# Patient Record
Sex: Male | Born: 1960 | Race: Black or African American | Hispanic: No | Marital: Single | Smoking: Current some day smoker
Health system: Southern US, Community
[De-identification: ages and names within clinical notes are randomized; demographics above are authoritative.]

## PROBLEM LIST (undated history)

## (undated) DIAGNOSIS — R413 Other amnesia: Secondary | ICD-10-CM

## (undated) DIAGNOSIS — J449 Chronic obstructive pulmonary disease, unspecified: Secondary | ICD-10-CM

## (undated) DIAGNOSIS — N289 Disorder of kidney and ureter, unspecified: Secondary | ICD-10-CM

## (undated) DIAGNOSIS — E119 Type 2 diabetes mellitus without complications: Secondary | ICD-10-CM

## (undated) DIAGNOSIS — F909 Attention-deficit hyperactivity disorder, unspecified type: Secondary | ICD-10-CM

## (undated) DIAGNOSIS — F329 Major depressive disorder, single episode, unspecified: Secondary | ICD-10-CM

## (undated) DIAGNOSIS — J45909 Unspecified asthma, uncomplicated: Secondary | ICD-10-CM

## (undated) DIAGNOSIS — Z86718 Personal history of other venous thrombosis and embolism: Secondary | ICD-10-CM

## (undated) DIAGNOSIS — F32A Depression, unspecified: Secondary | ICD-10-CM

## (undated) HISTORY — DX: Chronic obstructive pulmonary disease, unspecified: J44.9

## (undated) HISTORY — PX: HEMORRHOID SURGERY: SHX153

## (undated) HISTORY — DX: Attention-deficit hyperactivity disorder, unspecified type: F90.9

## (undated) HISTORY — PX: KNEE SURGERY: SHX244

## (undated) HISTORY — DX: Other amnesia: R41.3

## (undated) HISTORY — PX: FOOT SURGERY: SHX648

## (undated) HISTORY — PX: APPENDECTOMY: SHX54

---

## 1998-08-22 ENCOUNTER — Emergency Department (HOSPITAL_COMMUNITY): Admission: EM | Admit: 1998-08-22 | Discharge: 1998-08-22 | Payer: Self-pay | Admitting: Emergency Medicine

## 1998-09-14 ENCOUNTER — Encounter: Admission: RE | Admit: 1998-09-14 | Discharge: 1998-09-14 | Payer: Self-pay | Admitting: Internal Medicine

## 1998-09-15 ENCOUNTER — Encounter: Admission: RE | Admit: 1998-09-15 | Discharge: 1998-09-15 | Payer: Self-pay | Admitting: Hematology and Oncology

## 1998-09-24 ENCOUNTER — Ambulatory Visit (HOSPITAL_COMMUNITY): Admission: RE | Admit: 1998-09-24 | Discharge: 1998-09-24 | Payer: Self-pay

## 2003-04-22 ENCOUNTER — Emergency Department (HOSPITAL_COMMUNITY): Admission: EM | Admit: 2003-04-22 | Discharge: 2003-04-22 | Payer: Self-pay | Admitting: Emergency Medicine

## 2004-07-17 ENCOUNTER — Emergency Department (HOSPITAL_COMMUNITY): Admission: EM | Admit: 2004-07-17 | Discharge: 2004-07-18 | Payer: Self-pay | Admitting: Emergency Medicine

## 2005-09-05 ENCOUNTER — Emergency Department (HOSPITAL_COMMUNITY): Admission: EM | Admit: 2005-09-05 | Discharge: 2005-09-05 | Payer: Self-pay | Admitting: Emergency Medicine

## 2006-07-27 ENCOUNTER — Emergency Department (HOSPITAL_COMMUNITY): Admission: EM | Admit: 2006-07-27 | Discharge: 2006-07-28 | Payer: Self-pay | Admitting: Emergency Medicine

## 2006-07-29 ENCOUNTER — Emergency Department (HOSPITAL_COMMUNITY): Admission: EM | Admit: 2006-07-29 | Discharge: 2006-07-29 | Payer: Self-pay | Admitting: Family Medicine

## 2006-08-06 ENCOUNTER — Emergency Department (HOSPITAL_COMMUNITY): Admission: EM | Admit: 2006-08-06 | Discharge: 2006-08-06 | Payer: Self-pay | Admitting: Family Medicine

## 2009-12-21 ENCOUNTER — Emergency Department (HOSPITAL_COMMUNITY): Admission: EM | Admit: 2009-12-21 | Discharge: 2009-12-22 | Payer: Self-pay | Admitting: Emergency Medicine

## 2010-12-03 ENCOUNTER — Emergency Department (HOSPITAL_COMMUNITY)
Admission: EM | Admit: 2010-12-03 | Discharge: 2010-12-03 | Payer: Self-pay | Source: Home / Self Care | Admitting: Emergency Medicine

## 2010-12-06 LAB — COMPREHENSIVE METABOLIC PANEL
ALT: 24 U/L (ref 0–53)
AST: 32 U/L (ref 0–37)
Albumin: 3 g/dL — ABNORMAL LOW (ref 3.5–5.2)
Alkaline Phosphatase: 56 U/L (ref 39–117)
BUN: 9 mg/dL (ref 6–23)
CO2: 26 mEq/L (ref 19–32)
Calcium: 8.4 mg/dL (ref 8.4–10.5)
Chloride: 106 mEq/L (ref 96–112)
Creatinine, Ser: 0.99 mg/dL (ref 0.4–1.5)
GFR calc Af Amer: >60 mL/min (ref >60)
GFR calc non Af Amer: >60 mL/min (ref >60)
Glucose, Bld: 121 mg/dL — ABNORMAL HIGH (ref 70–99)
Potassium: 3.7 mEq/L (ref 3.5–5.1)
Sodium: 138 mEq/L (ref 135–145)
Total Bilirubin: 1.2 mg/dL (ref 0.3–1.2)
Total Protein: 5.7 g/dL — ABNORMAL LOW (ref 6.0–8.3)

## 2010-12-06 LAB — URINALYSIS, ROUTINE W REFLEX MICROSCOPIC
Bilirubin Urine: NEGATIVE
Ketones, ur: NEGATIVE mg/dL
Leukocytes, UA: NEGATIVE
Nitrite: NEGATIVE
Protein, ur: NEGATIVE mg/dL
Specific Gravity, Urine: 1.016 (ref 1.005–1.030)
Urine Glucose, Fasting: NEGATIVE mg/dL
Urobilinogen, UA: 2 mg/dL — ABNORMAL HIGH (ref 0.0–1.0)
pH: 6 (ref 5.0–8.0)

## 2010-12-06 LAB — DIFFERENTIAL
Basophils Absolute: 0 10*3/uL (ref 0.0–0.1)
Basophils Relative: 0 % (ref 0–1)
Eosinophils Absolute: 0.3 10*3/uL (ref 0.0–0.7)
Eosinophils Relative: 4 % (ref 0–5)
Lymphocytes Relative: 14 % (ref 12–46)
Lymphs Abs: 1 10*3/uL (ref 0.7–4.0)
Monocytes Absolute: 0.9 10*3/uL (ref 0.1–1.0)
Monocytes Relative: 12 % (ref 3–12)
Neutro Abs: 5.2 10*3/uL (ref 1.7–7.7)
Neutrophils Relative %: 70 % (ref 43–77)

## 2010-12-06 LAB — CBC
HCT: 39.2 % (ref 39.0–52.0)
Hemoglobin: 13.6 g/dL (ref 13.0–17.0)
MCH: 31.7 pg (ref 26.0–34.0)
MCHC: 34.7 g/dL (ref 30.0–36.0)
MCV: 91.4 fL (ref 78.0–100.0)
Platelets: 207 10*3/uL (ref 150–400)
RBC: 4.29 MIL/uL (ref 4.22–5.81)
RDW: 11.6 % (ref 11.5–15.5)
WBC: 7.4 10*3/uL (ref 4.0–10.5)

## 2010-12-06 LAB — URINE MICROSCOPIC-ADD ON

## 2011-01-25 ENCOUNTER — Emergency Department (HOSPITAL_COMMUNITY)
Admission: EM | Admit: 2011-01-25 | Discharge: 2011-01-25 | Disposition: A | Discharge status: Home / Self Care | Payer: Medicaid Other | Attending: Emergency Medicine | Admitting: Emergency Medicine

## 2011-01-25 DIAGNOSIS — J4489 Other specified chronic obstructive pulmonary disease: Secondary | ICD-10-CM | POA: Insufficient documentation

## 2011-01-25 DIAGNOSIS — M25519 Pain in unspecified shoulder: Secondary | ICD-10-CM | POA: Insufficient documentation

## 2011-01-25 DIAGNOSIS — J449 Chronic obstructive pulmonary disease, unspecified: Secondary | ICD-10-CM | POA: Insufficient documentation

## 2011-01-25 DIAGNOSIS — H409 Unspecified glaucoma: Secondary | ICD-10-CM | POA: Insufficient documentation

## 2011-01-25 DIAGNOSIS — F988 Other specified behavioral and emotional disorders with onset usually occurring in childhood and adolescence: Secondary | ICD-10-CM | POA: Insufficient documentation

## 2011-01-25 DIAGNOSIS — R109 Unspecified abdominal pain: Secondary | ICD-10-CM | POA: Insufficient documentation

## 2011-01-25 DIAGNOSIS — G8929 Other chronic pain: Secondary | ICD-10-CM | POA: Insufficient documentation

## 2011-01-25 DIAGNOSIS — K5900 Constipation, unspecified: Secondary | ICD-10-CM | POA: Insufficient documentation

## 2011-02-01 LAB — CBC
HCT: 42.5 % (ref 39.0–52.0)
MCV: 98 fL (ref 78.0–100.0)
Platelets: 198 10*3/uL (ref 150–400)
RBC: 4.33 MIL/uL (ref 4.22–5.81)
WBC: 10.9 10*3/uL — ABNORMAL HIGH (ref 4.0–10.5)

## 2011-02-01 LAB — URINALYSIS, ROUTINE W REFLEX MICROSCOPIC
Glucose, UA: NEGATIVE mg/dL
Nitrite: NEGATIVE
Specific Gravity, Urine: 1.025 (ref 1.005–1.030)
pH: 6 (ref 5.0–8.0)

## 2011-02-01 LAB — DIFFERENTIAL
Lymphocytes Relative: 19 % (ref 12–46)
Lymphs Abs: 2.1 10*3/uL (ref 0.7–4.0)
Monocytes Relative: 12 % (ref 3–12)
Neutrophils Relative %: 64 % (ref 43–77)

## 2011-02-01 LAB — RAPID URINE DRUG SCREEN, HOSP PERFORMED
Barbiturates: NOT DETECTED
Benzodiazepines: NOT DETECTED

## 2011-02-01 LAB — D-DIMER, QUANTITATIVE: D-Dimer, Quant: 0.29 ug/mL-FEU (ref 0.00–0.48)

## 2011-02-01 LAB — BASIC METABOLIC PANEL
BUN: 14 mg/dL (ref 6–23)
Chloride: 104 mEq/L (ref 96–112)
GFR calc Af Amer: >60 mL/min (ref >60)
GFR calc non Af Amer: >60 mL/min (ref >60)
Potassium: 3.8 mEq/L (ref 3.5–5.1)

## 2011-02-01 LAB — URINE MICROSCOPIC-ADD ON

## 2011-02-01 LAB — SALICYLATE LEVEL: Salicylate Lvl: <4 mg/dL (ref 2.8–20.0)

## 2011-02-01 LAB — POCT CARDIAC MARKERS
CKMB, poc: <1 ng/mL — ABNORMAL LOW (ref 1.0–8.0)
Myoglobin, poc: 34.2 ng/mL (ref 12–200)
Troponin i, poc: <0.05 ng/mL (ref 0.00–0.09)

## 2011-02-01 LAB — ETHANOL: Alcohol, Ethyl (B): <5 mg/dL (ref 0–10)

## 2011-02-01 LAB — ACETAMINOPHEN LEVEL: Acetaminophen (Tylenol), Serum: <10 ug/mL — ABNORMAL LOW (ref 10–30)

## 2011-08-26 ENCOUNTER — Emergency Department (HOSPITAL_COMMUNITY)
Admission: EM | Admit: 2011-08-26 | Discharge: 2011-08-26 | Disposition: A | Discharge status: Home / Self Care | Payer: Medicaid Other | Attending: Emergency Medicine | Admitting: Emergency Medicine

## 2011-08-26 ENCOUNTER — Emergency Department (HOSPITAL_COMMUNITY): Payer: Medicaid Other

## 2011-08-26 DIAGNOSIS — W11XXXA Fall on and from ladder, initial encounter: Secondary | ICD-10-CM | POA: Insufficient documentation

## 2011-08-26 DIAGNOSIS — M25519 Pain in unspecified shoulder: Secondary | ICD-10-CM | POA: Insufficient documentation

## 2011-08-26 DIAGNOSIS — S40019A Contusion of unspecified shoulder, initial encounter: Secondary | ICD-10-CM | POA: Insufficient documentation

## 2011-08-26 DIAGNOSIS — F988 Other specified behavioral and emotional disorders with onset usually occurring in childhood and adolescence: Secondary | ICD-10-CM | POA: Insufficient documentation

## 2011-08-26 DIAGNOSIS — M25539 Pain in unspecified wrist: Secondary | ICD-10-CM | POA: Insufficient documentation

## 2011-08-26 DIAGNOSIS — J4489 Other specified chronic obstructive pulmonary disease: Secondary | ICD-10-CM | POA: Insufficient documentation

## 2011-08-26 DIAGNOSIS — F17200 Nicotine dependence, unspecified, uncomplicated: Secondary | ICD-10-CM | POA: Insufficient documentation

## 2011-08-26 DIAGNOSIS — S63509A Unspecified sprain of unspecified wrist, initial encounter: Secondary | ICD-10-CM | POA: Insufficient documentation

## 2011-08-26 DIAGNOSIS — J449 Chronic obstructive pulmonary disease, unspecified: Secondary | ICD-10-CM | POA: Insufficient documentation

## 2012-05-27 ENCOUNTER — Ambulatory Visit: Payer: Self-pay

## 2014-10-20 LAB — DRUG SCREEN, URINE
Amphetamines, Ur Screen: NEGATIVE (ref ?–1000)
BARBITURATES, UR SCREEN: NEGATIVE (ref ?–200)
Benzodiazepine, Ur Scrn: NEGATIVE (ref ?–200)
CANNABINOID 50 NG, UR ~~LOC~~: NEGATIVE (ref ?–50)
Cocaine Metabolite,Ur ~~LOC~~: NEGATIVE (ref ?–300)
MDMA (ECSTASY) UR SCREEN: NEGATIVE (ref ?–500)
Methadone, Ur Screen: NEGATIVE (ref ?–300)
Opiate, Ur Screen: NEGATIVE (ref ?–300)
PHENCYCLIDINE (PCP) UR S: NEGATIVE (ref ?–25)
TRICYCLIC, UR SCREEN: NEGATIVE (ref ?–1000)

## 2014-10-20 LAB — COMPREHENSIVE METABOLIC PANEL
ALBUMIN: 2.8 g/dL — AB (ref 3.4–5.0)
ANION GAP: 8 (ref 7–16)
Alkaline Phosphatase: 70 U/L
BILIRUBIN TOTAL: 0.6 mg/dL (ref 0.2–1.0)
BUN: 18 mg/dL (ref 7–18)
CALCIUM: 8.8 mg/dL (ref 8.5–10.1)
Chloride: 95 mmol/L — ABNORMAL LOW (ref 98–107)
Co2: 28 mmol/L (ref 21–32)
Creatinine: 1.44 mg/dL — ABNORMAL HIGH (ref 0.60–1.30)
EGFR (African American): >60
GFR CALC NON AF AMER: 55 — AB
Glucose: 131 mg/dL — ABNORMAL HIGH (ref 65–99)
OSMOLALITY: 266 (ref 275–301)
Potassium: 4 mmol/L (ref 3.5–5.1)
SGOT(AST): 30 U/L (ref 15–37)
SGPT (ALT): 34 U/L
Sodium: 131 mmol/L — ABNORMAL LOW (ref 136–145)
TOTAL PROTEIN: 8.2 g/dL (ref 6.4–8.2)

## 2014-10-20 LAB — URINALYSIS, COMPLETE
BILIRUBIN, UR: NEGATIVE
GLUCOSE, UR: NEGATIVE mg/dL (ref 0–75)
Ketone: NEGATIVE
Nitrite: NEGATIVE
PH: 6 (ref 4.5–8.0)
Protein: 30
SPECIFIC GRAVITY: 1.01 (ref 1.003–1.030)
WBC UR: 1676 /HPF (ref 0–5)

## 2014-10-20 LAB — CBC
HCT: 42.5 % (ref 40.0–52.0)
HGB: 13.8 g/dL (ref 13.0–18.0)
MCH: 32.3 pg (ref 26.0–34.0)
MCHC: 32.5 g/dL (ref 32.0–36.0)
MCV: 100 fL (ref 80–100)
Platelet: 258 10*3/uL (ref 150–440)
RBC: 4.27 10*6/uL — ABNORMAL LOW (ref 4.40–5.90)
RDW: 11.9 % (ref 11.5–14.5)
WBC: 14.7 10*3/uL — AB (ref 3.8–10.6)

## 2014-10-20 LAB — AMMONIA: Ammonia, Plasma: 18 mcmol/L (ref 11–32)

## 2014-10-20 LAB — ETHANOL: Ethanol: <3 mg/dL

## 2014-10-21 ENCOUNTER — Inpatient Hospital Stay: Payer: Self-pay | Admitting: Internal Medicine

## 2014-10-21 LAB — CBC WITH DIFFERENTIAL/PLATELET
BASOS ABS: 0.1 10*3/uL (ref 0.0–0.1)
BASOS PCT: 0.7 %
EOS PCT: 0.3 %
Eosinophil #: 0 10*3/uL (ref 0.0–0.7)
HCT: 38.3 % — ABNORMAL LOW (ref 40.0–52.0)
HGB: 12.4 g/dL — ABNORMAL LOW (ref 13.0–18.0)
LYMPHS PCT: 6.4 %
Lymphocyte #: 1 10*3/uL (ref 1.0–3.6)
MCH: 32.3 pg (ref 26.0–34.0)
MCHC: 32.3 g/dL (ref 32.0–36.0)
MCV: 100 fL (ref 80–100)
MONO ABS: 2.4 x10 3/mm — AB (ref 0.2–1.0)
Monocyte %: 15.7 %
Neutrophil #: 11.9 10*3/uL — ABNORMAL HIGH (ref 1.4–6.5)
Neutrophil %: 76.9 %
PLATELETS: 240 10*3/uL (ref 150–440)
RBC: 3.84 10*6/uL — AB (ref 4.40–5.90)
RDW: 12.2 % (ref 11.5–14.5)
WBC: 15.5 10*3/uL — ABNORMAL HIGH (ref 3.8–10.6)

## 2014-10-21 LAB — GC/CHLAMYDIA PROBE AMP

## 2014-10-22 LAB — CBC WITH DIFFERENTIAL/PLATELET
BASOS ABS: 0.1 10*3/uL (ref 0.0–0.1)
Basophil %: 0.6 %
EOS ABS: 0.2 10*3/uL (ref 0.0–0.7)
Eosinophil %: 2.1 %
HCT: 35.7 % — AB (ref 40.0–52.0)
HGB: 11.9 g/dL — AB (ref 13.0–18.0)
LYMPHS ABS: 1 10*3/uL (ref 1.0–3.6)
LYMPHS PCT: 11.3 %
MCH: 33 pg (ref 26.0–34.0)
MCHC: 33.4 g/dL (ref 32.0–36.0)
MCV: 99 fL (ref 80–100)
Monocyte #: 1.4 x10 3/mm — ABNORMAL HIGH (ref 0.2–1.0)
Monocyte %: 14.7 %
NEUTROS ABS: 6.6 10*3/uL — AB (ref 1.4–6.5)
NEUTROS PCT: 71.3 %
Platelet: 246 10*3/uL (ref 150–440)
RBC: 3.61 10*6/uL — ABNORMAL LOW (ref 4.40–5.90)
RDW: 11.7 % (ref 11.5–14.5)
WBC: 9.2 10*3/uL (ref 3.8–10.6)

## 2014-10-22 LAB — BASIC METABOLIC PANEL
Anion Gap: 7 (ref 7–16)
BUN: 11 mg/dL (ref 7–18)
CO2: 27 mmol/L (ref 21–32)
Calcium, Total: 8.3 mg/dL — ABNORMAL LOW (ref 8.5–10.1)
Chloride: 103 mmol/L (ref 98–107)
Creatinine: 0.93 mg/dL (ref 0.60–1.30)
EGFR (African American): >60
EGFR (Non-African Amer.): >60
Glucose: 112 mg/dL — ABNORMAL HIGH (ref 65–99)
Osmolality: 274 (ref 275–301)
POTASSIUM: 4 mmol/L (ref 3.5–5.1)
Sodium: 137 mmol/L (ref 136–145)

## 2014-10-23 LAB — URINE CULTURE

## 2015-03-06 NOTE — Discharge Summary (Signed)
PATIENT NAME:  Jesse Lam, Jesse Lam MR#:  [id number] DATE OF BIRTH:  1961-07-07  DATE OF ADMISSION:  10/21/2014 DATE OF DISCHARGE:    DISCHARGE DIAGNOSES:  1.  Gram-negative rod urinary tract infection with possible pyelonephritis.  2.  Anemia of chronic disease.  3.  Bipolar disorder.  4.  Hyponatremia.  5.  Dehydration.   IMAGING STUDIES: Include: 1.  CT scan of the head without contrast for headache which showed no acute changes.  2.  CT scan of the abdomen and pelvis without contrast for a stone. Showed bladder wall thickening suggesting cystitis or hypertrophy , mild stranding around kidneys, may indicate pyelonephritis.   ADMITTING HISTORY AND PHYSICAL: Please see detailed H and P dictated by Dr. Sheryle Hailiamond. In brief, a 54 year old African American male patient brought into the hospital complaining of abdominal pain. The patient was found to have a URI and pyelonephritis. Admitted to hospitalist service.   HOSPITAL COURSE:  1.  Gram-negative rod UTI with nephritis. The patient is being discharged home on ciprofloxacin now that he is afebrile. White count has trended down to normal range. His acute renal failure has resolved and the patient's symptoms have stopped. He is being given ciprofloxacin to finish a 10-day course. His onset of UTI is likely secondary from his urinary retention and hesitancy from his prostate issues. He has also been started on Flomax. He will follow up with his primary care physician. I have advised him to return to the Emergency Room if he has any further fever. 2.  Prior to discharge has no abdominal tenderness. Bowel sounds are present. Lungs sound clear. S1, S2 heard.   DISCHARGE MEDICATIONS:  1.  Flomax 0.4 mg daily.  2.  Ciprofloxacin 500 mg oral 2 times a day.  3.  Norco 5/325 one tablet oral 3 times a day as needed for pain.   DISCHARGE INSTRUCTIONS: Regular diet. Activity as tolerated. Follow up with Dr. Maryellen PileEason in 1-2 weeks.   TIME SPENT ON DAY OF DISCHARGE  AND DISCHARGE ACTIVITY: 35 minutes.  ____________________________ Molinda BailiffSrikar R. Lilliane Sposito, MD srs:am D: 10/22/2014 12:06:27 ET T: 10/23/2014 01:25:57 ET JOB#: [id number]  cc: Wardell HeathSrikar R. Phineas Mcenroe, MD, <Dictator> Orie FishermanSRIKAR R [employer] MD ELECTRONICALLY SIGNED 10/29/2014 13:09

## 2015-03-06 NOTE — H&P (Signed)
PATIENT NAME:  Jesse Lam, Jesse Lam MR#:  [id number] DATE OF BIRTH:  1961-11-13  DATE OF ADMISSION:  10/21/2014  REFERRING PHYSICIAN: Francene Castle, MD  PRIMARY CARE PHYSICIAN: Nonlocal.   ADMISSION DIAGNOSIS: Pyelonephritis.   HISTORY OF PRESENT ILLNESS: This is a 54 year old African American male who presents to the Emergency Department complaining of 7 days of pain in his abdomen and difficulty urinating. The patient states that he started feeling sick with a slight fever at the beginning of that time. Now he says he is "shutting down". The patient states that the main concern is mostly pain. He states that it radiates from the left lower quadrant to the right upper quadrant and around to his flanks. At this time, the patient complains of headache and admitting to feeling like he has trouble focusing. He states that he has a lot of "infection in his face". The patient also admits that he has some shoulder pain. Evaluation in the Emergency Department showed a urinalysis consistent with urinary tract infection and CT scan of the abdomen which showed some perinephric stranding consistent with pyelonephritis, which prompted the Emergency Department to call for admission.   REVIEW OF SYSTEMS: CONSTITUTIONAL: The patient admits to subjective fevers and generalized malaise.  EYES: Denies inflammation or blurred vision.  EARS, NOSE AND THROAT: Denies tinnitus or sore throat.  RESPIRATORY: Denies cough or shortness of breath.  CARDIOVASCULAR: Denies chest pain or palpitations.  GASTROINTESTINAL: Admits to abdominal pain as well as some nausea, but denies vomiting or diarrhea.  GENITOURINARY: The patient denies dysuria, but admits to increased frequency of urination.  ENDOCRINE: Denies polyuria or polydipsia.  HEMATOLOGIC AND LYMPHATIC: Denies easy bruising or bleeding.  INTEGUMENT: Denies rashes or lesions.  MUSCULOSKELETAL: Denies arthralgias, but admits to some generalized muscle aches.  NEUROLOGIC:  Denies numbness in his extremities. PSYCHIATRIC: The patient admits to severe ADHD as well as depression and anxiety, but denies any suicidal or homicidal ideation. He also denies any auditory or visual hallucinations.   PAST MEDICAL HISTORY: Attention deficit hyperactivity disorder, depression, anxiety, unrepaired rotator cuff injury.   PAST SURGICAL HISTORY: Multiple abdominal surgeries, although the patient only has 1 scar on his belly from an appendectomy, hemorrhoidectomies, a left hand reconstruction, as well as repair of left foot injury from stepping on glass. It is unclear if this was an orthopedic or vascular surgery.   SOCIAL HISTORY: The patient has a history of smoking, alcohol and drug abuse. He is vague and unfocused when trying to quantify and define specifically which substances he prefers to abuse.  FAMILY HISTORY: Cancer throughout the family.   MEDICATIONS: None.   ALLERGIES: No known drug allergies.   PERTINENT LABORATORY RESULTS AND RADIOGRAPHIC FINDINGS: Serum glucose 131, BUN 18, creatinine 1.44, serum sodium 131, potassium 4, chloride 95, CO2 28, calcium 8.8, serum albumin 2.8, alk phos 70, AST 30, ALT 34. Urine drug screen is negative. White blood cell count 14.7, hemoglobin 13.8, hematocrit 42.5, MCV 100. GC/Chlamydia is negative. Urinalysis shows 1676 white blood cells per high-power field. His urine is nitrite negative and 3+ leukocyte esterase positive.  X-ray of the abdomen shows mild bronchitic changes in the lungs with a normal gas pattern throughout the bowel and a nonobstructing renal calculus versus costal cartilaginous calcification at the mid right kidney that is 7 x 5 mm in dimensions.  CT of the abdomen and pelvis shows bladder wall thickening suggestive of cystitis and hypertrophy due to outlet obstruction. There is no renal or ureteral stone or  obstruction. The patient has mild stranding around the kidneys that may indicate pyelonephritis.   CT of the  patient's head is unremarkable.   PHYSICAL EXAMINATION: VITAL SIGNS: Temperature is 98, pulse 52, respirations 18, blood pressure 111/61, pulse ox 100% on room air.  GENERAL: The patient is alert and oriented x3, but in some distress, be it psychological or physical. The patient's complaints are wide ranging and sometimes nonspecific.  HEENT: Normocephalic, atraumatic. Pupils equal, round and reactive to light and accommodation. Extraocular movements are intact. Mucous membranes are moist. There is no photophobia nor any sinus tenderness to palpation.  NECK: Trachea is midline.  CHEST: Symmetric and atraumatic.  CARDIOVASCULAR: Regular rate and rhythm. Normal S1, S2. No rubs, clicks, or murmurs appreciated.  LUNGS: Clear to auscultation bilaterally. Normal effort and excursion.  ABDOMEN: Positive bowel sounds. Soft. Diffusely tender with positive guarding, but no rebound tenderness. There is no hepatosplenomegaly. There is a scar on the right lower quadrant consistent with previous appendectomy.  GENITOURINARY: Normal external male genitalia.  MUSCULOSKELETAL: The patient moves all 4 extremities equally. There is 5/5 strength in upper and lower extremities bilaterally.  SKIN: No rashes or lesions.  EXTREMITIES: No clubbing, cyanosis, or edema. NEUROLOGIC: Cranial nerves II through XII are grossly intact.  PSYCHIATRIC: Mood is anxious. Affect is congruent.   ASSESSMENT AND PLAN: This is a 54 year old male admitted for pyelonephritis.  1.  Pyelonephritis, stranding seen on CT of the abdomen. The patient endorses tremendous pain but thankfully he is afebrile and does not have any peritoneal signs. He is given ceftriaxone in the Emergency Department. We will continue antibiotics and try to manage the patient's pain accordingly.  2.  Depression and anxiety. This likely amplifies the patient's perception of pain. He may need a psych consult while in house or certainly mental health follow-up upon  discharge.  3.  Attention deficit/hyperactivity disorder. The patient is very hard to follow. His history of present illness is somewhat disjointed. The patient is pleasant, but may have deeper psychiatric issues, although as stated above he does not admit to any hallucinations.  4.  Shoulder pain. The patient has a history of an unrepaired rotator cuff. His shoulder pain and abdominal pain could be some referred pain from his viscera; however, his gallbladder and pancreas have a normal appearance on CAT scan.  5.  Deep vein thrombosis prophylaxis. Heparin.  6.  Gastrointestinal prophylaxis. None, although I might consider gastroesophageal reflux treatment in this patient was multiple abdominal complaints.   CODE STATUS: The patient is a FULL code.   TIME SPENT ON ADMISSION ORDERS AND PATIENT CARE: Approximately 45 minutes. ____________________________ Norva Riffle. Marcille Blanco, MD msd:sb D: 10/21/2014 08:20:14 ET T: 10/21/2014 08:33:47 ET JOB#: [id number]  cc: Norva Riffle. Marcille Blanco, MD, <Dictator> Norva Riffle Annalyssa Thune MD ELECTRONICALLY SIGNED 10/30/2014 0:09

## 2015-04-06 ENCOUNTER — Emergency Department (HOSPITAL_COMMUNITY)
Admission: EM | Admit: 2015-04-06 | Discharge: 2015-04-06 | Disposition: A | Discharge status: Home / Self Care | Payer: Medicaid Other | Attending: Emergency Medicine | Admitting: Emergency Medicine

## 2015-04-06 ENCOUNTER — Emergency Department (HOSPITAL_COMMUNITY): Payer: Medicaid Other

## 2015-04-06 ENCOUNTER — Encounter (HOSPITAL_COMMUNITY): Payer: Self-pay

## 2015-04-06 DIAGNOSIS — Z720 Tobacco use: Secondary | ICD-10-CM | POA: Insufficient documentation

## 2015-04-06 DIAGNOSIS — Y9339 Activity, other involving climbing, rappelling and jumping off: Secondary | ICD-10-CM | POA: Diagnosis not present

## 2015-04-06 DIAGNOSIS — Y998 Other external cause status: Secondary | ICD-10-CM | POA: Insufficient documentation

## 2015-04-06 DIAGNOSIS — E119 Type 2 diabetes mellitus without complications: Secondary | ICD-10-CM | POA: Insufficient documentation

## 2015-04-06 DIAGNOSIS — S0990XA Unspecified injury of head, initial encounter: Secondary | ICD-10-CM | POA: Insufficient documentation

## 2015-04-06 DIAGNOSIS — W228XXA Striking against or struck by other objects, initial encounter: Secondary | ICD-10-CM | POA: Insufficient documentation

## 2015-04-06 DIAGNOSIS — Y9289 Other specified places as the place of occurrence of the external cause: Secondary | ICD-10-CM | POA: Diagnosis not present

## 2015-04-06 DIAGNOSIS — Z8659 Personal history of other mental and behavioral disorders: Secondary | ICD-10-CM | POA: Diagnosis not present

## 2015-04-06 DIAGNOSIS — Z87448 Personal history of other diseases of urinary system: Secondary | ICD-10-CM | POA: Insufficient documentation

## 2015-04-06 DIAGNOSIS — S199XXA Unspecified injury of neck, initial encounter: Secondary | ICD-10-CM | POA: Diagnosis not present

## 2015-04-06 DIAGNOSIS — J45909 Unspecified asthma, uncomplicated: Secondary | ICD-10-CM | POA: Diagnosis not present

## 2015-04-06 DIAGNOSIS — R202 Paresthesia of skin: Secondary | ICD-10-CM

## 2015-04-06 HISTORY — DX: Major depressive disorder, single episode, unspecified: F32.9

## 2015-04-06 HISTORY — DX: Unspecified asthma, uncomplicated: J45.909

## 2015-04-06 HISTORY — DX: Personal history of other venous thrombosis and embolism: Z86.718

## 2015-04-06 HISTORY — DX: Depression, unspecified: F32.A

## 2015-04-06 HISTORY — DX: Type 2 diabetes mellitus without complications: E11.9

## 2015-04-06 HISTORY — DX: Disorder of kidney and ureter, unspecified: N28.9

## 2015-04-06 LAB — CBG MONITORING, ED: GLUCOSE-CAPILLARY: 89 mg/dL (ref 65–99)

## 2015-04-06 MED ORDER — HYDROCODONE-ACETAMINOPHEN 5-325 MG PO TABS: 1.0000 | ORAL_TABLET | ORAL | Status: DC | PRN

## 2015-04-06 NOTE — ED Provider Notes (Signed)
CSN: [id number]     Arrival date & time 04/06/15  1749 History  This chart is scribed for non-physician practitioner, Trixie Dredge, PA-C, working with Arby Barrette, MD by Abel Presto, ED Scribe.  This patient was seen in room WTR9/WTR9 and the patient's care was started 6:19 PM.      Chief Complaint  Patient presents with  . Neck Injury  . Numbness    The history is provided by the patient. No language interpreter was used.   HPI Comments: Jesse Lam is a 54 y.o. male with PMHx of DM, ADD and renal disorderwho presents to the Emergency Department complaining of neck injury 3 days ago. Pt states he lost his balance while opening a window. Pt states his legs gave way and he fell forward slamming his forehead into the window sill. He states he jumped up quickly after impact and window came down onto the back of his neck. He reports associated left and posterior neck pain described as "an elephant sitting on my neck, "numbness in bilateral hands, severe pins and needles pain in left upper arm.". Pt has taken Tylenol and uses muscle cream for relief. Pt is able to ambulate. Pt reports h/o of several concussions and notes recurrent double vision and equilibrium problems at baseline. He notes he was recently diagnosed with DM but he is unsure of how diagnosis came about. He reports recent neuropathy in left leg and states pain in left arm is similar.  He denies worsening changes in equilibrium since recent incident. Pt denies LOC and confusion.  States that overall, since the injury, his symptoms are gradually improving. Pt is not on blood thinners but does take daily ASA.  Past Medical History  Diagnosis Date  . Diabetes mellitus without complication   . Asthma   . Depression   . Renal disorder    Past Surgical History  Procedure Laterality Date  . Appendectomy    . Hemorrhoid surgery    . Knee surgery     History reviewed. No pertinent family history. History  Substance Use Topics  .  Smoking status: Current Some Day Smoker  . Smokeless tobacco: Not on file  . Alcohol Use: Yes     Comment: social    Review of Systems  Constitutional: Negative for activity change, appetite change and fatigue.  HENT: Negative for facial swelling.   Eyes: Positive for visual disturbance (chronic, unchanged).  Musculoskeletal: Positive for myalgias, arthralgias and neck pain. Negative for back pain, joint swelling and gait problem.  Skin: Positive for wound (healing wound right forehead).  Allergic/Immunologic: Negative for immunocompromised state.  Neurological: Positive for weakness, numbness and headaches. Negative for syncope and speech difficulty.  Hematological: Does not bruise/bleed easily.  Psychiatric/Behavioral: Positive for decreased concentration (baseline). Negative for confusion and self-injury (accidental).      Allergies  Review of patient's allergies indicates no known allergies.  Home Medications   Prior to Admission medications   Not on File   BP 130/75 mmHg  Pulse 78  Temp(Src) 98.5 F (36.9 C) (Oral)  Resp 18  SpO2 99% Physical Exam  Constitutional: He appears well-developed and well-nourished. No distress.  HENT:  Head: Normocephalic.    Neck: Neck supple.  Pulmonary/Chest: Effort normal.  Musculoskeletal:       Cervical back: He exhibits tenderness (posterior and left neck).       Back:  Upper extremities:  Strength 5/5, sensation intact, distal pulses intact.   Increased pain and sensitivity to light  touch over bilateral 3rd-5th fingers making pt unable to do grip strength testing. Lower extremities:  Strength 5/5, sensation intact, distal pulses intact.      Neurological: He is alert. He has normal strength. No sensory deficit. He exhibits normal muscle tone. GCS eye subscore is 4. GCS verbal subscore is 5. GCS motor subscore is 6.  CN II-XII intact, EOMs intact, no pronator drift, grip strengths equal bilaterally; strength 5/5 in all  extremities, sensation intact in all extremities; finger to nose, heel to shin, rapid alternating movements normal; gait is normal.    Skin: He is not diaphoretic.  Nursing note and vitals reviewed.   ED Course  Procedures (including critical care time) DIAGNOSTIC STUDIES: Oxygen Saturation is 99% on room air, normal by my interpretation.    COORDINATION OF CARE: 6:27 PM Discussed treatment plan with patient at beside, the patient agrees with the plan and has no further questions at this time.   Labs Review Labs Reviewed - No data to display  Imaging Review Ct Head Wo Contrast  04/06/2015   CLINICAL DATA:  Neck injury 3 days ago, bilateral finger numbness  EXAM: CT HEAD WITHOUT CONTRAST  CT CERVICAL SPINE WITHOUT CONTRAST  TECHNIQUE: Multidetector CT imaging of the head and cervical spine was performed following the standard protocol without intravenous contrast. Multiplanar CT image reconstructions of the cervical spine were also generated.  COMPARISON:  10/20/2014  FINDINGS: CT HEAD FINDINGS  No skull fracture is noted. No intracranial hemorrhage, mass effect or midline shift. Paranasal sinuses and mastoid air cells are unremarkable.  Ventricular size is stable from prior exam. No acute cortical infarction. No mass lesion is noted on this unenhanced scan.  CT CERVICAL SPINE FINDINGS  Axial images of the cervical spine shows no acute fracture or subluxation. Computer processed images shows no acute fracture or subluxation. Degenerative changes are noted C1-C2 articulation. There is moderate disc space flattening with mild anterior and mild posterior spurring at C5-C6 level. No prevertebral soft tissue swelling. Cervical airway is patent.  There is no pneumothorax in visualized lung apices. Mild emphysematous changes are noted bilateral lung apices.  IMPRESSION: 1. No acute intracranial abnormality.  No significant change. 2. No cervical spine acute fracture or subluxation. Degenerative changes at  C1-C2 and C5-C6 level. 3. Mild emphysematous changes bilateral lung apices.   Electronically Signed   By: Natasha Mead M.D.   On: 04/06/2015 19:05   Ct Cervical Spine Wo Contrast  04/06/2015   CLINICAL DATA:  Neck injury 3 days ago, bilateral finger numbness  EXAM: CT HEAD WITHOUT CONTRAST  CT CERVICAL SPINE WITHOUT CONTRAST  TECHNIQUE: Multidetector CT imaging of the head and cervical spine was performed following the standard protocol without intravenous contrast. Multiplanar CT image reconstructions of the cervical spine were also generated.  COMPARISON:  10/20/2014  FINDINGS: CT HEAD FINDINGS  No skull fracture is noted. No intracranial hemorrhage, mass effect or midline shift. Paranasal sinuses and mastoid air cells are unremarkable.  Ventricular size is stable from prior exam. No acute cortical infarction. No mass lesion is noted on this unenhanced scan.  CT CERVICAL SPINE FINDINGS  Axial images of the cervical spine shows no acute fracture or subluxation. Computer processed images shows no acute fracture or subluxation. Degenerative changes are noted C1-C2 articulation. There is moderate disc space flattening with mild anterior and mild posterior spurring at C5-C6 level. No prevertebral soft tissue swelling. Cervical airway is patent.  There is no pneumothorax in visualized lung apices. Mild  emphysematous changes are noted bilateral lung apices.  IMPRESSION: 1. No acute intracranial abnormality.  No significant change. 2. No cervical spine acute fracture or subluxation. Degenerative changes at C1-C2 and C5-C6 level. 3. Mild emphysematous changes bilateral lung apices.   Electronically Signed   By: Natasha MeadLiviu  Pop M.D.   On: 04/06/2015 19:05     EKG Interpretation None       7:17 PM Discussed pt, results, and plan with Dr Donnald GarrePfeiffer.    MDM   Final diagnoses:  Neck injury, initial encounter  Paresthesia    Afebrile, nontoxic patient with injury to neck 3 days ago when jumping up and hitting it on  the windowsill.  No head injury or LOC.  Has since had tingling in his bilateral upper extremities.  He is having no gait disturbance an no lower extremity symptoms.  He has full strength in his arms.  He notes the symptoms, pain and paresthesia, have gradually improved since the incident.  CT negative for acute injury.   D/C home with norco, neurology and PCP follow up.  Discussed result, findings, treatment, and follow up  with patient.  Pt given return precautions.  Pt verbalizes understanding and agrees with plan.       I personally performed the services described in this documentation, which was scribed in my presence. The recorded information has been reviewed and is accurate.     Trixie Dredgemily Meia Emley, PA-C 04/06/15 2025  Arby BarretteMarcy Pfeiffer, MD 04/09/15 [phone]

## 2015-04-06 NOTE — ED Notes (Signed)
CBG 89 

## 2015-04-06 NOTE — ED Notes (Signed)
Pt states he was opening a window x 3 days ago.  Pt struck forehead on window and then upon retrieval of head, pt struck back of neck.  Pt states since then he had some blurred vision at times with numbness in bilateral hands.  Pt states blurred vision is normal d/t needing glasses.

## 2015-04-06 NOTE — Discharge Instructions (Signed)
Read the information below.  Use the prescribed medication as directed.  Please discuss all new medications with your pharmacist.  Do not take additional tylenol while taking the prescribed pain medication to avoid overdose.  You may return to the Emergency Department at any time for worsening condition or any new symptoms that concern you.    If you develop fevers, loss of control of bowel or bladder, weakness or numbness in your arms or legs, or are unable to walk, return to the ER for a recheck.    Paresthesia Paresthesia is an abnormal burning or prickling sensation. This sensation is generally felt in the hands, arms, legs, or feet. However, it may occur in any part of the body. It is usually not painful. The feeling may be described as:  Tingling or numbness.  "Pins and needles."  Skin crawling.  Buzzing.  Limbs "falling asleep."  Itching. Most people experience temporary (transient) paresthesia at some time in their lives. CAUSES  Paresthesia may occur when you breathe too quickly (hyperventilation). It can also occur without any apparent cause. Commonly, paresthesia occurs when pressure is placed on a nerve. The feeling quickly goes away once the pressure is removed. For some people, however, paresthesia is a long-lasting (chronic) condition caused by an underlying disorder. The underlying disorder may be:  A traumatic, direct injury to nerves. Examples include a:  Broken (fractured) neck.  Fractured skull.  A disorder affecting the brain and spinal cord (central nervous system). Examples include:  Transverse myelitis.  Encephalitis.  Transient ischemic attack.  Multiple sclerosis.  Stroke.  Tumor or blood vessel problems, such as an arteriovenous malformation pressing against the brain or spinal cord.  A condition that damages the peripheral nerves (peripheral neuropathy). Peripheral nerves are not part of the brain and spinal cord. These conditions  include:  Diabetes.  Peripheral vascular disease.  Nerve entrapment syndromes, such as carpal tunnel syndrome.  Shingles.  Hypothyroidism.  Vitamin B12 deficiencies.  Alcoholism.  Heavy metal poisoning (lead, arsenic).  Rheumatoid arthritis.  Systemic lupus erythematosus. DIAGNOSIS  Your caregiver will attempt to find the underlying cause of your paresthesia. Your caregiver may:  Take your medical history.  Perform a physical exam.  Order various lab tests.  Order imaging tests. TREATMENT  Treatment for paresthesia depends on the underlying cause. HOME CARE INSTRUCTIONS  Avoid drinking alcohol.  You may consider massage or acupuncture to help relieve your symptoms.  Keep all follow-up appointments as directed by your caregiver. SEEK IMMEDIATE MEDICAL CARE IF:   You feel weak.  You have trouble walking or moving.  You have problems with speech or vision.  You feel confused.  You cannot control your bladder or bowel movements.  You feel numbness after an injury.  You faint.  Your burning or prickling feeling gets worse when walking.  You have pain, cramps, or dizziness.  You develop a rash. MAKE SURE YOU:  Understand these instructions.  Will watch your condition.  Will get help right away if you are not doing well or get worse. Document Released: 10/20/2002 Document Revised: 01/22/2012 Document Reviewed: 07/21/2011 [facility] Patient Information 2015 [place], [place]. This information is not intended to replace advice given to you by your health care provider. Make sure you discuss any questions you have with your health care provider.

## 2016-10-03 ENCOUNTER — Ambulatory Visit: Payer: Medicaid Other | Admitting: Neurology

## 2016-10-04 ENCOUNTER — Encounter: Payer: Self-pay | Admitting: Neurology

## 2016-10-31 ENCOUNTER — Telehealth: Payer: Self-pay

## 2016-10-31 ENCOUNTER — Ambulatory Visit: Payer: Medicaid Other | Admitting: Neurology

## 2016-10-31 NOTE — Telephone Encounter (Signed)
Pt no-showed his new patient appt this morning. 

## 2016-11-03 ENCOUNTER — Encounter: Payer: Self-pay | Admitting: Neurology

## 2016-11-27 ENCOUNTER — Encounter (HOSPITAL_COMMUNITY): Payer: Self-pay | Admitting: Emergency Medicine

## 2016-11-27 ENCOUNTER — Ambulatory Visit (HOSPITAL_COMMUNITY)
Admission: EM | Admit: 2016-11-27 | Discharge: 2016-11-27 | Disposition: A | Discharge status: Home / Self Care | Payer: Medicaid Other | Attending: Emergency Medicine | Admitting: Emergency Medicine

## 2016-11-27 ENCOUNTER — Ambulatory Visit (INDEPENDENT_AMBULATORY_CARE_PROVIDER_SITE_OTHER): Payer: Medicaid Other

## 2016-11-27 DIAGNOSIS — R110 Nausea: Secondary | ICD-10-CM | POA: Insufficient documentation

## 2016-11-27 DIAGNOSIS — E119 Type 2 diabetes mellitus without complications: Secondary | ICD-10-CM | POA: Diagnosis not present

## 2016-11-27 DIAGNOSIS — F329 Major depressive disorder, single episode, unspecified: Secondary | ICD-10-CM | POA: Insufficient documentation

## 2016-11-27 DIAGNOSIS — Z8249 Family history of ischemic heart disease and other diseases of the circulatory system: Secondary | ICD-10-CM | POA: Diagnosis not present

## 2016-11-27 DIAGNOSIS — J45909 Unspecified asthma, uncomplicated: Secondary | ICD-10-CM | POA: Diagnosis not present

## 2016-11-27 DIAGNOSIS — R197 Diarrhea, unspecified: Secondary | ICD-10-CM | POA: Insufficient documentation

## 2016-11-27 DIAGNOSIS — Z809 Family history of malignant neoplasm, unspecified: Secondary | ICD-10-CM | POA: Insufficient documentation

## 2016-11-27 DIAGNOSIS — F17200 Nicotine dependence, unspecified, uncomplicated: Secondary | ICD-10-CM | POA: Insufficient documentation

## 2016-11-27 DIAGNOSIS — Z9889 Other specified postprocedural states: Secondary | ICD-10-CM | POA: Diagnosis not present

## 2016-11-27 DIAGNOSIS — Z86718 Personal history of other venous thrombosis and embolism: Secondary | ICD-10-CM | POA: Insufficient documentation

## 2016-11-27 DIAGNOSIS — Z833 Family history of diabetes mellitus: Secondary | ICD-10-CM | POA: Insufficient documentation

## 2016-11-27 DIAGNOSIS — R109 Unspecified abdominal pain: Secondary | ICD-10-CM | POA: Diagnosis present

## 2016-11-27 DIAGNOSIS — J069 Acute upper respiratory infection, unspecified: Secondary | ICD-10-CM | POA: Diagnosis not present

## 2016-11-27 DIAGNOSIS — N200 Calculus of kidney: Secondary | ICD-10-CM | POA: Diagnosis not present

## 2016-11-27 DIAGNOSIS — Z79899 Other long term (current) drug therapy: Secondary | ICD-10-CM | POA: Diagnosis not present

## 2016-11-27 LAB — POCT URINALYSIS DIP (DEVICE)
Bilirubin Urine: NEGATIVE
Glucose, UA: NEGATIVE mg/dL
KETONES UR: NEGATIVE mg/dL
Leukocytes, UA: NEGATIVE
Nitrite: NEGATIVE
PH: 7 (ref 5.0–8.0)
PROTEIN: NEGATIVE mg/dL
SPECIFIC GRAVITY, URINE: 1.02 (ref 1.005–1.030)
UROBILINOGEN UA: 1 mg/dL (ref 0.0–1.0)

## 2016-11-27 LAB — POCT I-STAT, CHEM 8
BUN: 15 mg/dL (ref 6–20)
CALCIUM ION: 1.11 mmol/L — AB (ref 1.15–1.40)
CHLORIDE: 101 mmol/L (ref 101–111)
CREATININE: 1.2 mg/dL (ref 0.61–1.24)
GLUCOSE: 105 mg/dL — AB (ref 65–99)
HCT: 43 % (ref 39.0–52.0)
Hemoglobin: 14.6 g/dL (ref 13.0–17.0)
Potassium: 4 mmol/L (ref 3.5–5.1)
Sodium: 137 mmol/L (ref 135–145)
TCO2: 28 mmol/L (ref 0–100)

## 2016-11-27 MED ORDER — GI COCKTAIL ~~LOC~~
30.0000 mL | Freq: Once | ORAL | Status: AC
  Administered 2016-11-27: 30 mL via ORAL

## 2016-11-27 MED ORDER — CIPROFLOXACIN HCL 500 MG PO TABS: 500.0000 mg | ORAL_TABLET | Freq: Two times a day (BID) | ORAL | 0 refills | Status: DC

## 2016-11-27 MED ORDER — GI COCKTAIL ~~LOC~~
ORAL | Status: AC
  Filled 2016-11-27: qty 30

## 2016-11-27 MED ORDER — TAMSULOSIN HCL 0.4 MG PO CAPS: 0.4000 mg | ORAL_CAPSULE | Freq: Every day | ORAL | 0 refills | Status: DC

## 2016-11-27 NOTE — ED Provider Notes (Signed)
[facility]    CSN: [id number] Arrival date & time: 11/27/16  1122     History   Chief Complaint Chief Complaint  Patient presents with  . Abdominal Pain    HPI Jesse Lam is a 56 y.o. male.   HPI  He is a 56 year old man here for evaluation of abdominal pain. He reports a six-day history of sharp pains in his abdomen. They're fairly constant. Nothing really makes them worse or better. He had one episode of nausea, but no vomiting. He has had diarrhea over the last week. No blood in the stool. No urinary symptoms. He states he had a similar pain several years ago at which time he was told he had blood clots around his heart. On review of records, he was admitted December 2015 with pyelonephritis.  2 days ago, he states he developed cough, congestion, runny nose, sinus pressure, and low-grade fevers. He did get the flu shot relatively recently.  Past Medical History:  Diagnosis Date  . Asthma   . Depression   . Diabetes mellitus without complication (HCC)   . History of blood clots    "blood clots around heart 6 months ago"  . Renal disorder     There are no active problems to display for this patient.   Past Surgical History:  Procedure Laterality Date  . APPENDECTOMY    . HEMORRHOID SURGERY    . KNEE SURGERY         Home Medications    Prior to Admission medications   Medication Sig Start Date End Date Taking? Authorizing Provider  albuterol (PROVENTIL HFA;VENTOLIN HFA) 108 (90 Base) MCG/ACT inhaler Inhale into the lungs every 6 (six) hours as needed for wheezing or shortness of breath.   Yes Historical Provider, MD  buPROPion (WELLBUTRIN) 100 MG tablet Take 100 mg by mouth 2 (two) times daily.   Yes Historical Provider, MD  cholecalciferol (VITAMIN D) 1000 units tablet Take 1,000 Units by mouth daily.   Yes Historical Provider, MD  ciprofloxacin (CIPRO) 500 MG tablet Take 1 tablet (500 mg total) by mouth 2 (two) times daily. 11/27/16   Charm Rings, MD  tamsulosin (FLOMAX) 0.4 MG CAPS capsule Take 1 capsule (0.4 mg total) by mouth daily. 11/27/16   Charm Rings, MD    Family History Family History  Problem Relation Age of Onset  . Cancer Mother   . Hypertension Mother   . Diabetes Father   . Hypertension Father   . Cancer Other     Social History Social History  Substance Use Topics  . Smoking status: Current Some Day Smoker  . Smokeless tobacco: Not on file  . Alcohol use Yes     Comment: social     Allergies   Patient has no known allergies.   Review of Systems Review of Systems As in history of present illness  Physical Exam Triage Vital Signs ED Triage Vitals [11/27/16 1226]  Enc Vitals Group     BP 116/77     Pulse Rate 67     Resp 16     Temp 98.2 F (36.8 C)     Temp Source Oral     SpO2 98 %     Weight      Height      Head Circumference      Peak Flow      Pain Score      Pain Loc      Pain Edu?  Excl. in GC?    No data found.   Updated Vital Signs BP 116/77 (BP Location: Left Arm)   Pulse 67   Temp 98.2 F (36.8 C) (Oral)   Resp 16   SpO2 98%   Visual Acuity Right Eye Distance:   Left Eye Distance:   Bilateral Distance:    Right Eye Near:   Left Eye Near:    Bilateral Near:     Physical Exam  Constitutional: He is oriented to person, place, and time. He appears well-developed and well-nourished. No distress.  Neck: Neck supple.  Cardiovascular: Normal rate, regular rhythm and normal heart sounds.   No murmur heard. Pulmonary/Chest: Effort normal and breath sounds normal. No respiratory distress. He has no wheezes. He has no rales.  Abdominal: Soft. Bowel sounds are normal. He exhibits no distension. There is tenderness (generalized but worse in epigastric). There is no rebound and no guarding.  Neurological: He is alert and oriented to person, place, and time.     UC Treatments / Results  Labs (all labs ordered are listed, but only abnormal results are  displayed) Labs Reviewed  POCT I-STAT, CHEM 8 - Abnormal; Notable for the following:       Result Value   Glucose, Bld 105 (*)    Calcium, Ion 1.11 (*)    All other components within normal limits  POCT URINALYSIS DIP (DEVICE) - Abnormal; Notable for the following:    Hgb urine dipstick TRACE (*)    All other components within normal limits  URINE CULTURE    EKG  EKG Interpretation None       Radiology Dg Chest 2 View  Result Date: 11/27/2016 CLINICAL DATA:  Cough.  Chest and abdominal pain. EXAM: CHEST  2 VIEW COMPARISON:  05/27/2012 FINDINGS: The cardiomediastinal silhouette is within normal limits. The lungs are well inflated and clear. There is no evidence of pleural effusion or pneumothorax. There is minimal pleural thickening in the lung apices. No acute osseous abnormality is identified. IMPRESSION: No active cardiopulmonary disease. Electronically Signed   By: Sebastian AcheAllen  Grady M.D.   On: 11/27/2016 13:22    Procedures ED EKG Date/Time: 11/27/2016 1:56 PM Performed by: Charm RingsHONIG, ERIN J Authorized by: Charm RingsHONIG, ERIN J   ECG reviewed by ED Physician in the absence of a cardiologist: yes   Previous ECG:    Previous ECG:  Unavailable Interpretation:    Interpretation: normal   Rate:    ECG rate:  56   ECG rate assessment: bradycardic   Rhythm:    Rhythm: sinus bradycardia   Ectopy:    Ectopy: none   QRS:    QRS axis:  Normal   QRS intervals:  Normal Conduction:    Conduction: normal   ST segments:    ST segments:  Normal T waves:    T waves: normal   Comments:     Sinus bradycardia, otherwise normal   (including critical care time)  Medications Ordered in UC Medications  gi cocktail (Maalox,Lidocaine,Donnatal) (30 mLs Oral Given 11/27/16 1359)     Initial Impression / Assessment and Plan / UC Course  I have reviewed the triage vital signs and the nursing notes.  Pertinent labs & imaging results that were available during my care of the patient were reviewed  by me and considered in my medical decision making (see chart for details).  Clinical Course     No change in abdominal pain after the GI cocktail.  Since presentation is similar, though  less severe, then episode 2 years ago with pyelonephritis and kidney stones, will treat with Flomax and Cipro. He does have blood in his urine. Urine culture sent.  He states he has another antibiotic prescription for an upper respiratory illness a month ago that he never filled. It is okay for him to fill this.  Discussed continuing TheraFlu and symptomatic treatment.  Follow-up as needed.  Final Clinical Impressions(s) / UC Diagnoses   Final diagnoses:  Abdominal pain, unspecified abdominal location  URI, acute    New Prescriptions New Prescriptions   CIPROFLOXACIN (CIPRO) 500 MG TABLET    Take 1 tablet (500 mg total) by mouth 2 (two) times daily.   TAMSULOSIN (FLOMAX) 0.4 MG CAPS CAPSULE    Take 1 capsule (0.4 mg total) by mouth daily.     Charm Rings, MD 11/27/16 [phone]

## 2016-11-27 NOTE — Discharge Instructions (Signed)
We are going to treat you as if you have a kidney stone and infection again. Take Flomax daily for 2 weeks. Take Cipro twice a day for 10 days. Go ahead and fill the other antibiotic for upper respiratory symptoms. If things are not improving, you develop fevers, or are getting worse, please come back here or go to the emergency room.

## 2016-11-27 NOTE — ED Notes (Signed)
Patient provided a urinary strainer.

## 2016-11-27 NOTE — ED Triage Notes (Signed)
[facility] a [facility] of abdominal pain x 6 days and sinus pain and congestion x 4 days.

## 2016-11-28 LAB — URINE CULTURE: CULTURE: NO GROWTH

## 2017-07-11 ENCOUNTER — Ambulatory Visit (HOSPITAL_COMMUNITY)
Admission: EM | Admit: 2017-07-11 | Discharge: 2017-07-11 | Disposition: A | Discharge status: Home / Self Care | Payer: Medicaid Other | Attending: Family Medicine | Admitting: Family Medicine

## 2017-07-11 ENCOUNTER — Encounter (HOSPITAL_COMMUNITY): Payer: Self-pay | Admitting: Nurse Practitioner

## 2017-07-11 DIAGNOSIS — L0291 Cutaneous abscess, unspecified: Secondary | ICD-10-CM

## 2017-07-11 MED ORDER — SULFAMETHOXAZOLE-TRIMETHOPRIM 800-160 MG PO TABS: 1.0000 | ORAL_TABLET | Freq: Two times a day (BID) | ORAL | 0 refills | Status: AC

## 2017-07-11 NOTE — ED Triage Notes (Signed)
Pt presents with c/o abscess to his right neck. The abscess has been increasingly swollen and painful over the past two weeks and is now draining fluid. He's tried applying vinegar and alcohol at home with no improvement

## 2017-07-12 NOTE — ED Provider Notes (Signed)
  [facility]   [id number] 07/11/17 Arrival Time: 1625  ASSESSMENT & PLAN:  1. Abscess     Meds ordered this encounter  Medications  . sulfamethoxazole-trimethoprim (BACTRIM DS,SEPTRA DS) 800-160 MG tablet    Sig: Take 1 tablet by mouth 2 (two) times daily.    Dispense:  14 tablet    Refill:  0    Procedure: Verbal consent obtained. Area over induration cleaned with betadine. Since this is so small, no anesthesia used. #11 blade to lance fluctuant area. Expression of purulent material. Minimal bleeding. No complications.  Wound care instructions discussed and given in written format.  Finish all antibiotics. OTC analgesics as needed. May f/u as needed.  Reviewed expectations re: course of current medical issues. Questions answered. Outlined signs and symptoms indicating need for more acute intervention. Patient verbalized understanding. After Visit Summary given.   SUBJECTIVE:  Jesse Lam is a 56 y.o. male who presents with a possible abscess of his R neck. H/O similar in past that have gone away without treatmet. This present for 1-2 weeks. Stable in size. Application of vinegar without help. Did drain some a few days ago; "pus". Still very painful. Afebrile.  ROS: As per HPI.  OBJECTIVE:  Vitals:   07/11/17 1700  BP: 114/77  Pulse: 60  Resp: 17  Temp: 98.4 F (36.9 C)  TempSrc: Oral  SpO2: 99%     General appearance: alert; no distress Skin: 1.5 cm induration on R neck at edge of beard/facial hair; tender; no active drainage or bleeding; neck with FROM Psychological:  alert and cooperative; normal mood and affect  No Known Allergies  Past Medical History:  Diagnosis Date  . Asthma   . Depression   . Diabetes mellitus without complication (HCC)   . History of blood clots    "blood clots around heart 6 months ago"  . Renal disorder     Past Surgical History:  Procedure Laterality Date  . APPENDECTOMY    . HEMORRHOID SURGERY    . KNEE  SURGERY             Mardella LaymanHagler, Klarissa Mcilvain, MD 07/12/17 [phone]

## 2018-05-13 DIAGNOSIS — I119 Hypertensive heart disease without heart failure: Secondary | ICD-10-CM | POA: Diagnosis not present

## 2018-05-14 DIAGNOSIS — I119 Hypertensive heart disease without heart failure: Secondary | ICD-10-CM | POA: Diagnosis not present

## 2018-05-15 DIAGNOSIS — I119 Hypertensive heart disease without heart failure: Secondary | ICD-10-CM | POA: Diagnosis not present

## 2018-05-16 DIAGNOSIS — I119 Hypertensive heart disease without heart failure: Secondary | ICD-10-CM | POA: Diagnosis not present

## 2018-05-17 DIAGNOSIS — I119 Hypertensive heart disease without heart failure: Secondary | ICD-10-CM | POA: Diagnosis not present

## 2018-05-18 DIAGNOSIS — I119 Hypertensive heart disease without heart failure: Secondary | ICD-10-CM | POA: Diagnosis not present

## 2018-05-19 DIAGNOSIS — I119 Hypertensive heart disease without heart failure: Secondary | ICD-10-CM | POA: Diagnosis not present

## 2018-05-20 DIAGNOSIS — I119 Hypertensive heart disease without heart failure: Secondary | ICD-10-CM | POA: Diagnosis not present

## 2018-05-21 DIAGNOSIS — I119 Hypertensive heart disease without heart failure: Secondary | ICD-10-CM | POA: Diagnosis not present

## 2018-05-22 DIAGNOSIS — I119 Hypertensive heart disease without heart failure: Secondary | ICD-10-CM | POA: Diagnosis not present

## 2018-05-23 DIAGNOSIS — I119 Hypertensive heart disease without heart failure: Secondary | ICD-10-CM | POA: Diagnosis not present

## 2018-05-24 DIAGNOSIS — I119 Hypertensive heart disease without heart failure: Secondary | ICD-10-CM | POA: Diagnosis not present

## 2018-05-25 DIAGNOSIS — I119 Hypertensive heart disease without heart failure: Secondary | ICD-10-CM | POA: Diagnosis not present

## 2018-05-26 DIAGNOSIS — I119 Hypertensive heart disease without heart failure: Secondary | ICD-10-CM | POA: Diagnosis not present

## 2018-05-27 DIAGNOSIS — I119 Hypertensive heart disease without heart failure: Secondary | ICD-10-CM | POA: Diagnosis not present

## 2018-05-28 DIAGNOSIS — I119 Hypertensive heart disease without heart failure: Secondary | ICD-10-CM | POA: Diagnosis not present

## 2018-05-29 DIAGNOSIS — I119 Hypertensive heart disease without heart failure: Secondary | ICD-10-CM | POA: Diagnosis not present

## 2018-05-30 DIAGNOSIS — I119 Hypertensive heart disease without heart failure: Secondary | ICD-10-CM | POA: Diagnosis not present

## 2018-05-31 DIAGNOSIS — I119 Hypertensive heart disease without heart failure: Secondary | ICD-10-CM | POA: Diagnosis not present

## 2018-06-01 DIAGNOSIS — I119 Hypertensive heart disease without heart failure: Secondary | ICD-10-CM | POA: Diagnosis not present

## 2018-06-02 DIAGNOSIS — I119 Hypertensive heart disease without heart failure: Secondary | ICD-10-CM | POA: Diagnosis not present

## 2018-06-03 DIAGNOSIS — I119 Hypertensive heart disease without heart failure: Secondary | ICD-10-CM | POA: Diagnosis not present

## 2018-06-04 DIAGNOSIS — I119 Hypertensive heart disease without heart failure: Secondary | ICD-10-CM | POA: Diagnosis not present

## 2018-06-05 DIAGNOSIS — I119 Hypertensive heart disease without heart failure: Secondary | ICD-10-CM | POA: Diagnosis not present

## 2018-06-06 DIAGNOSIS — I119 Hypertensive heart disease without heart failure: Secondary | ICD-10-CM | POA: Diagnosis not present

## 2018-06-07 DIAGNOSIS — I119 Hypertensive heart disease without heart failure: Secondary | ICD-10-CM | POA: Diagnosis not present

## 2018-06-08 DIAGNOSIS — I119 Hypertensive heart disease without heart failure: Secondary | ICD-10-CM | POA: Diagnosis not present

## 2018-06-09 DIAGNOSIS — I119 Hypertensive heart disease without heart failure: Secondary | ICD-10-CM | POA: Diagnosis not present

## 2018-06-10 DIAGNOSIS — I119 Hypertensive heart disease without heart failure: Secondary | ICD-10-CM | POA: Diagnosis not present

## 2018-06-11 DIAGNOSIS — I119 Hypertensive heart disease without heart failure: Secondary | ICD-10-CM | POA: Diagnosis not present

## 2018-06-12 DIAGNOSIS — I119 Hypertensive heart disease without heart failure: Secondary | ICD-10-CM | POA: Diagnosis not present

## 2018-06-13 DIAGNOSIS — I119 Hypertensive heart disease without heart failure: Secondary | ICD-10-CM | POA: Diagnosis not present

## 2018-06-14 DIAGNOSIS — I119 Hypertensive heart disease without heart failure: Secondary | ICD-10-CM | POA: Diagnosis not present

## 2018-06-15 DIAGNOSIS — I119 Hypertensive heart disease without heart failure: Secondary | ICD-10-CM | POA: Diagnosis not present

## 2018-06-16 DIAGNOSIS — I119 Hypertensive heart disease without heart failure: Secondary | ICD-10-CM | POA: Diagnosis not present

## 2018-06-17 DIAGNOSIS — I119 Hypertensive heart disease without heart failure: Secondary | ICD-10-CM | POA: Diagnosis not present

## 2018-06-18 DIAGNOSIS — I119 Hypertensive heart disease without heart failure: Secondary | ICD-10-CM | POA: Diagnosis not present

## 2018-06-19 DIAGNOSIS — I119 Hypertensive heart disease without heart failure: Secondary | ICD-10-CM | POA: Diagnosis not present

## 2018-06-20 DIAGNOSIS — I119 Hypertensive heart disease without heart failure: Secondary | ICD-10-CM | POA: Diagnosis not present

## 2018-06-21 DIAGNOSIS — I119 Hypertensive heart disease without heart failure: Secondary | ICD-10-CM | POA: Diagnosis not present

## 2018-06-22 DIAGNOSIS — I119 Hypertensive heart disease without heart failure: Secondary | ICD-10-CM | POA: Diagnosis not present

## 2018-06-23 DIAGNOSIS — I119 Hypertensive heart disease without heart failure: Secondary | ICD-10-CM | POA: Diagnosis not present

## 2018-06-24 DIAGNOSIS — I119 Hypertensive heart disease without heart failure: Secondary | ICD-10-CM | POA: Diagnosis not present

## 2018-06-25 DIAGNOSIS — I119 Hypertensive heart disease without heart failure: Secondary | ICD-10-CM | POA: Diagnosis not present

## 2018-06-26 DIAGNOSIS — I119 Hypertensive heart disease without heart failure: Secondary | ICD-10-CM | POA: Diagnosis not present

## 2018-06-27 DIAGNOSIS — I119 Hypertensive heart disease without heart failure: Secondary | ICD-10-CM | POA: Diagnosis not present

## 2018-06-28 DIAGNOSIS — I119 Hypertensive heart disease without heart failure: Secondary | ICD-10-CM | POA: Diagnosis not present

## 2018-06-29 DIAGNOSIS — I119 Hypertensive heart disease without heart failure: Secondary | ICD-10-CM | POA: Diagnosis not present

## 2018-06-30 DIAGNOSIS — I119 Hypertensive heart disease without heart failure: Secondary | ICD-10-CM | POA: Diagnosis not present

## 2018-07-01 DIAGNOSIS — I119 Hypertensive heart disease without heart failure: Secondary | ICD-10-CM | POA: Diagnosis not present

## 2018-07-02 DIAGNOSIS — I119 Hypertensive heart disease without heart failure: Secondary | ICD-10-CM | POA: Diagnosis not present

## 2018-07-03 DIAGNOSIS — I119 Hypertensive heart disease without heart failure: Secondary | ICD-10-CM | POA: Diagnosis not present

## 2018-07-04 DIAGNOSIS — I119 Hypertensive heart disease without heart failure: Secondary | ICD-10-CM | POA: Diagnosis not present

## 2018-07-05 DIAGNOSIS — I119 Hypertensive heart disease without heart failure: Secondary | ICD-10-CM | POA: Diagnosis not present

## 2018-07-06 DIAGNOSIS — I119 Hypertensive heart disease without heart failure: Secondary | ICD-10-CM | POA: Diagnosis not present

## 2018-07-07 DIAGNOSIS — I119 Hypertensive heart disease without heart failure: Secondary | ICD-10-CM | POA: Diagnosis not present

## 2018-07-08 DIAGNOSIS — I119 Hypertensive heart disease without heart failure: Secondary | ICD-10-CM | POA: Diagnosis not present

## 2018-07-09 DIAGNOSIS — I119 Hypertensive heart disease without heart failure: Secondary | ICD-10-CM | POA: Diagnosis not present

## 2018-07-10 DIAGNOSIS — I119 Hypertensive heart disease without heart failure: Secondary | ICD-10-CM | POA: Diagnosis not present

## 2018-07-11 DIAGNOSIS — I119 Hypertensive heart disease without heart failure: Secondary | ICD-10-CM | POA: Diagnosis not present

## 2018-07-12 DIAGNOSIS — I119 Hypertensive heart disease without heart failure: Secondary | ICD-10-CM | POA: Diagnosis not present

## 2018-07-13 DIAGNOSIS — I119 Hypertensive heart disease without heart failure: Secondary | ICD-10-CM | POA: Diagnosis not present

## 2018-07-14 DIAGNOSIS — I119 Hypertensive heart disease without heart failure: Secondary | ICD-10-CM | POA: Diagnosis not present

## 2018-07-15 DIAGNOSIS — I119 Hypertensive heart disease without heart failure: Secondary | ICD-10-CM | POA: Diagnosis not present

## 2018-07-16 DIAGNOSIS — I119 Hypertensive heart disease without heart failure: Secondary | ICD-10-CM | POA: Diagnosis not present

## 2018-07-17 DIAGNOSIS — I119 Hypertensive heart disease without heart failure: Secondary | ICD-10-CM | POA: Diagnosis not present

## 2018-07-18 DIAGNOSIS — I119 Hypertensive heart disease without heart failure: Secondary | ICD-10-CM | POA: Diagnosis not present

## 2018-07-19 DIAGNOSIS — I119 Hypertensive heart disease without heart failure: Secondary | ICD-10-CM | POA: Diagnosis not present

## 2018-07-20 DIAGNOSIS — I119 Hypertensive heart disease without heart failure: Secondary | ICD-10-CM | POA: Diagnosis not present

## 2018-07-21 DIAGNOSIS — I119 Hypertensive heart disease without heart failure: Secondary | ICD-10-CM | POA: Diagnosis not present

## 2018-07-22 DIAGNOSIS — I119 Hypertensive heart disease without heart failure: Secondary | ICD-10-CM | POA: Diagnosis not present

## 2018-07-23 DIAGNOSIS — I119 Hypertensive heart disease without heart failure: Secondary | ICD-10-CM | POA: Diagnosis not present

## 2018-07-24 DIAGNOSIS — I119 Hypertensive heart disease without heart failure: Secondary | ICD-10-CM | POA: Diagnosis not present

## 2018-07-25 DIAGNOSIS — I119 Hypertensive heart disease without heart failure: Secondary | ICD-10-CM | POA: Diagnosis not present

## 2018-07-26 DIAGNOSIS — I119 Hypertensive heart disease without heart failure: Secondary | ICD-10-CM | POA: Diagnosis not present

## 2018-07-27 DIAGNOSIS — I119 Hypertensive heart disease without heart failure: Secondary | ICD-10-CM | POA: Diagnosis not present

## 2018-07-28 DIAGNOSIS — I119 Hypertensive heart disease without heart failure: Secondary | ICD-10-CM | POA: Diagnosis not present

## 2018-07-29 DIAGNOSIS — I119 Hypertensive heart disease without heart failure: Secondary | ICD-10-CM | POA: Diagnosis not present

## 2018-07-30 DIAGNOSIS — I119 Hypertensive heart disease without heart failure: Secondary | ICD-10-CM | POA: Diagnosis not present

## 2018-07-31 DIAGNOSIS — I119 Hypertensive heart disease without heart failure: Secondary | ICD-10-CM | POA: Diagnosis not present

## 2018-08-01 DIAGNOSIS — I119 Hypertensive heart disease without heart failure: Secondary | ICD-10-CM | POA: Diagnosis not present

## 2018-08-02 DIAGNOSIS — I119 Hypertensive heart disease without heart failure: Secondary | ICD-10-CM | POA: Diagnosis not present

## 2018-08-03 DIAGNOSIS — I119 Hypertensive heart disease without heart failure: Secondary | ICD-10-CM | POA: Diagnosis not present

## 2018-08-04 DIAGNOSIS — I119 Hypertensive heart disease without heart failure: Secondary | ICD-10-CM | POA: Diagnosis not present

## 2018-08-05 DIAGNOSIS — I119 Hypertensive heart disease without heart failure: Secondary | ICD-10-CM | POA: Diagnosis not present

## 2018-08-06 DIAGNOSIS — I119 Hypertensive heart disease without heart failure: Secondary | ICD-10-CM | POA: Diagnosis not present

## 2018-08-07 DIAGNOSIS — I119 Hypertensive heart disease without heart failure: Secondary | ICD-10-CM | POA: Diagnosis not present

## 2018-08-08 DIAGNOSIS — I119 Hypertensive heart disease without heart failure: Secondary | ICD-10-CM | POA: Diagnosis not present

## 2018-08-09 DIAGNOSIS — I119 Hypertensive heart disease without heart failure: Secondary | ICD-10-CM | POA: Diagnosis not present

## 2018-08-10 DIAGNOSIS — I119 Hypertensive heart disease without heart failure: Secondary | ICD-10-CM | POA: Diagnosis not present

## 2018-08-11 DIAGNOSIS — I119 Hypertensive heart disease without heart failure: Secondary | ICD-10-CM | POA: Diagnosis not present

## 2018-08-12 DIAGNOSIS — I119 Hypertensive heart disease without heart failure: Secondary | ICD-10-CM | POA: Diagnosis not present

## 2018-08-13 DIAGNOSIS — I119 Hypertensive heart disease without heart failure: Secondary | ICD-10-CM | POA: Diagnosis not present

## 2018-08-14 DIAGNOSIS — I119 Hypertensive heart disease without heart failure: Secondary | ICD-10-CM | POA: Diagnosis not present

## 2018-08-15 DIAGNOSIS — I119 Hypertensive heart disease without heart failure: Secondary | ICD-10-CM | POA: Diagnosis not present

## 2018-08-16 DIAGNOSIS — I119 Hypertensive heart disease without heart failure: Secondary | ICD-10-CM | POA: Diagnosis not present

## 2018-08-17 DIAGNOSIS — I119 Hypertensive heart disease without heart failure: Secondary | ICD-10-CM | POA: Diagnosis not present

## 2018-08-18 DIAGNOSIS — I119 Hypertensive heart disease without heart failure: Secondary | ICD-10-CM | POA: Diagnosis not present

## 2018-08-19 DIAGNOSIS — I119 Hypertensive heart disease without heart failure: Secondary | ICD-10-CM | POA: Diagnosis not present

## 2018-08-20 DIAGNOSIS — I119 Hypertensive heart disease without heart failure: Secondary | ICD-10-CM | POA: Diagnosis not present

## 2018-08-21 DIAGNOSIS — I119 Hypertensive heart disease without heart failure: Secondary | ICD-10-CM | POA: Diagnosis not present

## 2018-08-22 DIAGNOSIS — I119 Hypertensive heart disease without heart failure: Secondary | ICD-10-CM | POA: Diagnosis not present

## 2018-08-23 DIAGNOSIS — I119 Hypertensive heart disease without heart failure: Secondary | ICD-10-CM | POA: Diagnosis not present

## 2018-08-24 DIAGNOSIS — I119 Hypertensive heart disease without heart failure: Secondary | ICD-10-CM | POA: Diagnosis not present

## 2018-08-25 DIAGNOSIS — I119 Hypertensive heart disease without heart failure: Secondary | ICD-10-CM | POA: Diagnosis not present

## 2018-08-26 DIAGNOSIS — I119 Hypertensive heart disease without heart failure: Secondary | ICD-10-CM | POA: Diagnosis not present

## 2018-08-27 DIAGNOSIS — I119 Hypertensive heart disease without heart failure: Secondary | ICD-10-CM | POA: Diagnosis not present

## 2018-08-28 DIAGNOSIS — I119 Hypertensive heart disease without heart failure: Secondary | ICD-10-CM | POA: Diagnosis not present

## 2018-08-29 DIAGNOSIS — I119 Hypertensive heart disease without heart failure: Secondary | ICD-10-CM | POA: Diagnosis not present

## 2018-08-30 DIAGNOSIS — I119 Hypertensive heart disease without heart failure: Secondary | ICD-10-CM | POA: Diagnosis not present

## 2018-08-31 DIAGNOSIS — I119 Hypertensive heart disease without heart failure: Secondary | ICD-10-CM | POA: Diagnosis not present

## 2018-09-01 DIAGNOSIS — I119 Hypertensive heart disease without heart failure: Secondary | ICD-10-CM | POA: Diagnosis not present

## 2018-09-02 DIAGNOSIS — I119 Hypertensive heart disease without heart failure: Secondary | ICD-10-CM | POA: Diagnosis not present

## 2018-09-03 DIAGNOSIS — I119 Hypertensive heart disease without heart failure: Secondary | ICD-10-CM | POA: Diagnosis not present

## 2018-09-04 DIAGNOSIS — I119 Hypertensive heart disease without heart failure: Secondary | ICD-10-CM | POA: Diagnosis not present

## 2018-09-05 DIAGNOSIS — I119 Hypertensive heart disease without heart failure: Secondary | ICD-10-CM | POA: Diagnosis not present

## 2018-09-06 DIAGNOSIS — I119 Hypertensive heart disease without heart failure: Secondary | ICD-10-CM | POA: Diagnosis not present

## 2018-09-07 DIAGNOSIS — I119 Hypertensive heart disease without heart failure: Secondary | ICD-10-CM | POA: Diagnosis not present

## 2018-09-08 DIAGNOSIS — I119 Hypertensive heart disease without heart failure: Secondary | ICD-10-CM | POA: Diagnosis not present

## 2018-09-09 DIAGNOSIS — I119 Hypertensive heart disease without heart failure: Secondary | ICD-10-CM | POA: Diagnosis not present

## 2018-09-10 DIAGNOSIS — I119 Hypertensive heart disease without heart failure: Secondary | ICD-10-CM | POA: Diagnosis not present

## 2018-09-11 DIAGNOSIS — I119 Hypertensive heart disease without heart failure: Secondary | ICD-10-CM | POA: Diagnosis not present

## 2018-09-12 DIAGNOSIS — I119 Hypertensive heart disease without heart failure: Secondary | ICD-10-CM | POA: Diagnosis not present

## 2018-09-13 DIAGNOSIS — I119 Hypertensive heart disease without heart failure: Secondary | ICD-10-CM | POA: Diagnosis not present

## 2018-09-14 DIAGNOSIS — I119 Hypertensive heart disease without heart failure: Secondary | ICD-10-CM | POA: Diagnosis not present

## 2018-09-15 DIAGNOSIS — I119 Hypertensive heart disease without heart failure: Secondary | ICD-10-CM | POA: Diagnosis not present

## 2018-09-16 DIAGNOSIS — I119 Hypertensive heart disease without heart failure: Secondary | ICD-10-CM | POA: Diagnosis not present

## 2018-09-17 DIAGNOSIS — I119 Hypertensive heart disease without heart failure: Secondary | ICD-10-CM | POA: Diagnosis not present

## 2018-09-18 DIAGNOSIS — I119 Hypertensive heart disease without heart failure: Secondary | ICD-10-CM | POA: Diagnosis not present

## 2018-09-19 DIAGNOSIS — I119 Hypertensive heart disease without heart failure: Secondary | ICD-10-CM | POA: Diagnosis not present

## 2018-09-20 DIAGNOSIS — I119 Hypertensive heart disease without heart failure: Secondary | ICD-10-CM | POA: Diagnosis not present

## 2018-09-21 DIAGNOSIS — I119 Hypertensive heart disease without heart failure: Secondary | ICD-10-CM | POA: Diagnosis not present

## 2018-09-22 DIAGNOSIS — I119 Hypertensive heart disease without heart failure: Secondary | ICD-10-CM | POA: Diagnosis not present

## 2018-09-23 DIAGNOSIS — I119 Hypertensive heart disease without heart failure: Secondary | ICD-10-CM | POA: Diagnosis not present

## 2018-09-24 DIAGNOSIS — I119 Hypertensive heart disease without heart failure: Secondary | ICD-10-CM | POA: Diagnosis not present

## 2018-09-25 DIAGNOSIS — I119 Hypertensive heart disease without heart failure: Secondary | ICD-10-CM | POA: Diagnosis not present

## 2018-09-26 DIAGNOSIS — I119 Hypertensive heart disease without heart failure: Secondary | ICD-10-CM | POA: Diagnosis not present

## 2018-09-27 DIAGNOSIS — I119 Hypertensive heart disease without heart failure: Secondary | ICD-10-CM | POA: Diagnosis not present

## 2018-09-28 DIAGNOSIS — I119 Hypertensive heart disease without heart failure: Secondary | ICD-10-CM | POA: Diagnosis not present

## 2018-09-29 DIAGNOSIS — I119 Hypertensive heart disease without heart failure: Secondary | ICD-10-CM | POA: Diagnosis not present

## 2018-09-30 DIAGNOSIS — I119 Hypertensive heart disease without heart failure: Secondary | ICD-10-CM | POA: Diagnosis not present

## 2018-10-01 DIAGNOSIS — I119 Hypertensive heart disease without heart failure: Secondary | ICD-10-CM | POA: Diagnosis not present

## 2018-10-02 DIAGNOSIS — I119 Hypertensive heart disease without heart failure: Secondary | ICD-10-CM | POA: Diagnosis not present

## 2018-10-03 DIAGNOSIS — I119 Hypertensive heart disease without heart failure: Secondary | ICD-10-CM | POA: Diagnosis not present

## 2018-10-04 DIAGNOSIS — I119 Hypertensive heart disease without heart failure: Secondary | ICD-10-CM | POA: Diagnosis not present

## 2018-10-05 DIAGNOSIS — I119 Hypertensive heart disease without heart failure: Secondary | ICD-10-CM | POA: Diagnosis not present

## 2018-10-06 DIAGNOSIS — I119 Hypertensive heart disease without heart failure: Secondary | ICD-10-CM | POA: Diagnosis not present

## 2018-10-07 DIAGNOSIS — I119 Hypertensive heart disease without heart failure: Secondary | ICD-10-CM | POA: Diagnosis not present

## 2018-10-08 DIAGNOSIS — I119 Hypertensive heart disease without heart failure: Secondary | ICD-10-CM | POA: Diagnosis not present

## 2018-10-09 DIAGNOSIS — I119 Hypertensive heart disease without heart failure: Secondary | ICD-10-CM | POA: Diagnosis not present

## 2018-10-10 DIAGNOSIS — I119 Hypertensive heart disease without heart failure: Secondary | ICD-10-CM | POA: Diagnosis not present

## 2018-10-11 DIAGNOSIS — I119 Hypertensive heart disease without heart failure: Secondary | ICD-10-CM | POA: Diagnosis not present

## 2018-10-12 DIAGNOSIS — I119 Hypertensive heart disease without heart failure: Secondary | ICD-10-CM | POA: Diagnosis not present

## 2018-10-13 DIAGNOSIS — I119 Hypertensive heart disease without heart failure: Secondary | ICD-10-CM | POA: Diagnosis not present

## 2018-10-14 DIAGNOSIS — I119 Hypertensive heart disease without heart failure: Secondary | ICD-10-CM | POA: Diagnosis not present

## 2018-10-15 DIAGNOSIS — I119 Hypertensive heart disease without heart failure: Secondary | ICD-10-CM | POA: Diagnosis not present

## 2018-10-16 DIAGNOSIS — I119 Hypertensive heart disease without heart failure: Secondary | ICD-10-CM | POA: Diagnosis not present

## 2018-10-17 DIAGNOSIS — I119 Hypertensive heart disease without heart failure: Secondary | ICD-10-CM | POA: Diagnosis not present

## 2018-10-18 DIAGNOSIS — I119 Hypertensive heart disease without heart failure: Secondary | ICD-10-CM | POA: Diagnosis not present

## 2018-10-19 DIAGNOSIS — I119 Hypertensive heart disease without heart failure: Secondary | ICD-10-CM | POA: Diagnosis not present

## 2018-10-20 DIAGNOSIS — I119 Hypertensive heart disease without heart failure: Secondary | ICD-10-CM | POA: Diagnosis not present

## 2018-10-21 DIAGNOSIS — I119 Hypertensive heart disease without heart failure: Secondary | ICD-10-CM | POA: Diagnosis not present

## 2018-10-22 DIAGNOSIS — I119 Hypertensive heart disease without heart failure: Secondary | ICD-10-CM | POA: Diagnosis not present

## 2018-10-23 DIAGNOSIS — I119 Hypertensive heart disease without heart failure: Secondary | ICD-10-CM | POA: Diagnosis not present

## 2018-10-24 DIAGNOSIS — I119 Hypertensive heart disease without heart failure: Secondary | ICD-10-CM | POA: Diagnosis not present

## 2018-10-25 DIAGNOSIS — I119 Hypertensive heart disease without heart failure: Secondary | ICD-10-CM | POA: Diagnosis not present

## 2018-10-26 DIAGNOSIS — I119 Hypertensive heart disease without heart failure: Secondary | ICD-10-CM | POA: Diagnosis not present

## 2018-10-27 DIAGNOSIS — I119 Hypertensive heart disease without heart failure: Secondary | ICD-10-CM | POA: Diagnosis not present

## 2018-10-28 DIAGNOSIS — I119 Hypertensive heart disease without heart failure: Secondary | ICD-10-CM | POA: Diagnosis not present

## 2018-10-29 DIAGNOSIS — I119 Hypertensive heart disease without heart failure: Secondary | ICD-10-CM | POA: Diagnosis not present

## 2018-10-30 DIAGNOSIS — I119 Hypertensive heart disease without heart failure: Secondary | ICD-10-CM | POA: Diagnosis not present

## 2018-10-31 DIAGNOSIS — I119 Hypertensive heart disease without heart failure: Secondary | ICD-10-CM | POA: Diagnosis not present

## 2018-11-01 DIAGNOSIS — I119 Hypertensive heart disease without heart failure: Secondary | ICD-10-CM | POA: Diagnosis not present

## 2018-11-02 DIAGNOSIS — I119 Hypertensive heart disease without heart failure: Secondary | ICD-10-CM | POA: Diagnosis not present

## 2018-11-03 DIAGNOSIS — I119 Hypertensive heart disease without heart failure: Secondary | ICD-10-CM | POA: Diagnosis not present

## 2018-11-04 DIAGNOSIS — I119 Hypertensive heart disease without heart failure: Secondary | ICD-10-CM | POA: Diagnosis not present

## 2018-11-05 DIAGNOSIS — I119 Hypertensive heart disease without heart failure: Secondary | ICD-10-CM | POA: Diagnosis not present

## 2018-11-06 DIAGNOSIS — I119 Hypertensive heart disease without heart failure: Secondary | ICD-10-CM | POA: Diagnosis not present

## 2018-11-07 DIAGNOSIS — I119 Hypertensive heart disease without heart failure: Secondary | ICD-10-CM | POA: Diagnosis not present

## 2018-11-08 DIAGNOSIS — I119 Hypertensive heart disease without heart failure: Secondary | ICD-10-CM | POA: Diagnosis not present

## 2018-11-09 DIAGNOSIS — I119 Hypertensive heart disease without heart failure: Secondary | ICD-10-CM | POA: Diagnosis not present

## 2018-11-10 DIAGNOSIS — I119 Hypertensive heart disease without heart failure: Secondary | ICD-10-CM | POA: Diagnosis not present

## 2018-11-11 DIAGNOSIS — I119 Hypertensive heart disease without heart failure: Secondary | ICD-10-CM | POA: Diagnosis not present

## 2018-11-12 DIAGNOSIS — I119 Hypertensive heart disease without heart failure: Secondary | ICD-10-CM | POA: Diagnosis not present

## 2018-11-13 DIAGNOSIS — I119 Hypertensive heart disease without heart failure: Secondary | ICD-10-CM | POA: Diagnosis not present

## 2018-11-14 DIAGNOSIS — I119 Hypertensive heart disease without heart failure: Secondary | ICD-10-CM | POA: Diagnosis not present

## 2018-11-15 DIAGNOSIS — I119 Hypertensive heart disease without heart failure: Secondary | ICD-10-CM | POA: Diagnosis not present

## 2018-11-16 DIAGNOSIS — I119 Hypertensive heart disease without heart failure: Secondary | ICD-10-CM | POA: Diagnosis not present

## 2018-11-17 DIAGNOSIS — I119 Hypertensive heart disease without heart failure: Secondary | ICD-10-CM | POA: Diagnosis not present

## 2018-11-18 DIAGNOSIS — I119 Hypertensive heart disease without heart failure: Secondary | ICD-10-CM | POA: Diagnosis not present

## 2018-11-19 DIAGNOSIS — I119 Hypertensive heart disease without heart failure: Secondary | ICD-10-CM | POA: Diagnosis not present

## 2018-11-20 DIAGNOSIS — I119 Hypertensive heart disease without heart failure: Secondary | ICD-10-CM | POA: Diagnosis not present

## 2018-11-21 DIAGNOSIS — I119 Hypertensive heart disease without heart failure: Secondary | ICD-10-CM | POA: Diagnosis not present

## 2018-11-22 DIAGNOSIS — I119 Hypertensive heart disease without heart failure: Secondary | ICD-10-CM | POA: Diagnosis not present

## 2018-11-23 DIAGNOSIS — I119 Hypertensive heart disease without heart failure: Secondary | ICD-10-CM | POA: Diagnosis not present

## 2018-11-24 DIAGNOSIS — I119 Hypertensive heart disease without heart failure: Secondary | ICD-10-CM | POA: Diagnosis not present

## 2018-11-25 DIAGNOSIS — I119 Hypertensive heart disease without heart failure: Secondary | ICD-10-CM | POA: Diagnosis not present

## 2018-11-26 DIAGNOSIS — I119 Hypertensive heart disease without heart failure: Secondary | ICD-10-CM | POA: Diagnosis not present

## 2018-11-27 DIAGNOSIS — I119 Hypertensive heart disease without heart failure: Secondary | ICD-10-CM | POA: Diagnosis not present

## 2018-11-28 DIAGNOSIS — I119 Hypertensive heart disease without heart failure: Secondary | ICD-10-CM | POA: Diagnosis not present

## 2018-11-29 DIAGNOSIS — I119 Hypertensive heart disease without heart failure: Secondary | ICD-10-CM | POA: Diagnosis not present

## 2018-12-02 DIAGNOSIS — I119 Hypertensive heart disease without heart failure: Secondary | ICD-10-CM | POA: Diagnosis not present

## 2018-12-03 DIAGNOSIS — I119 Hypertensive heart disease without heart failure: Secondary | ICD-10-CM | POA: Diagnosis not present

## 2018-12-04 DIAGNOSIS — I119 Hypertensive heart disease without heart failure: Secondary | ICD-10-CM | POA: Diagnosis not present

## 2018-12-05 DIAGNOSIS — I119 Hypertensive heart disease without heart failure: Secondary | ICD-10-CM | POA: Diagnosis not present

## 2018-12-06 DIAGNOSIS — I119 Hypertensive heart disease without heart failure: Secondary | ICD-10-CM | POA: Diagnosis not present

## 2018-12-07 DIAGNOSIS — I119 Hypertensive heart disease without heart failure: Secondary | ICD-10-CM | POA: Diagnosis not present

## 2018-12-08 DIAGNOSIS — I119 Hypertensive heart disease without heart failure: Secondary | ICD-10-CM | POA: Diagnosis not present

## 2018-12-09 DIAGNOSIS — I119 Hypertensive heart disease without heart failure: Secondary | ICD-10-CM | POA: Diagnosis not present

## 2018-12-10 DIAGNOSIS — I119 Hypertensive heart disease without heart failure: Secondary | ICD-10-CM | POA: Diagnosis not present

## 2018-12-11 DIAGNOSIS — I119 Hypertensive heart disease without heart failure: Secondary | ICD-10-CM | POA: Diagnosis not present

## 2018-12-12 DIAGNOSIS — I119 Hypertensive heart disease without heart failure: Secondary | ICD-10-CM | POA: Diagnosis not present

## 2018-12-13 DIAGNOSIS — I119 Hypertensive heart disease without heart failure: Secondary | ICD-10-CM | POA: Diagnosis not present

## 2018-12-14 DIAGNOSIS — I119 Hypertensive heart disease without heart failure: Secondary | ICD-10-CM | POA: Diagnosis not present

## 2018-12-15 DIAGNOSIS — I119 Hypertensive heart disease without heart failure: Secondary | ICD-10-CM | POA: Diagnosis not present

## 2018-12-16 DIAGNOSIS — I119 Hypertensive heart disease without heart failure: Secondary | ICD-10-CM | POA: Diagnosis not present

## 2018-12-17 DIAGNOSIS — I119 Hypertensive heart disease without heart failure: Secondary | ICD-10-CM | POA: Diagnosis not present

## 2018-12-18 DIAGNOSIS — I119 Hypertensive heart disease without heart failure: Secondary | ICD-10-CM | POA: Diagnosis not present

## 2018-12-19 DIAGNOSIS — I119 Hypertensive heart disease without heart failure: Secondary | ICD-10-CM | POA: Diagnosis not present

## 2018-12-20 DIAGNOSIS — I119 Hypertensive heart disease without heart failure: Secondary | ICD-10-CM | POA: Diagnosis not present

## 2018-12-21 DIAGNOSIS — I119 Hypertensive heart disease without heart failure: Secondary | ICD-10-CM | POA: Diagnosis not present

## 2018-12-22 DIAGNOSIS — I119 Hypertensive heart disease without heart failure: Secondary | ICD-10-CM | POA: Diagnosis not present

## 2018-12-23 DIAGNOSIS — I119 Hypertensive heart disease without heart failure: Secondary | ICD-10-CM | POA: Diagnosis not present

## 2018-12-24 DIAGNOSIS — I119 Hypertensive heart disease without heart failure: Secondary | ICD-10-CM | POA: Diagnosis not present

## 2018-12-25 DIAGNOSIS — I119 Hypertensive heart disease without heart failure: Secondary | ICD-10-CM | POA: Diagnosis not present

## 2018-12-26 DIAGNOSIS — I119 Hypertensive heart disease without heart failure: Secondary | ICD-10-CM | POA: Diagnosis not present

## 2018-12-27 DIAGNOSIS — I119 Hypertensive heart disease without heart failure: Secondary | ICD-10-CM | POA: Diagnosis not present

## 2018-12-28 DIAGNOSIS — I119 Hypertensive heart disease without heart failure: Secondary | ICD-10-CM | POA: Diagnosis not present

## 2018-12-29 DIAGNOSIS — I119 Hypertensive heart disease without heart failure: Secondary | ICD-10-CM | POA: Diagnosis not present

## 2018-12-30 DIAGNOSIS — I119 Hypertensive heart disease without heart failure: Secondary | ICD-10-CM | POA: Diagnosis not present

## 2018-12-31 DIAGNOSIS — I119 Hypertensive heart disease without heart failure: Secondary | ICD-10-CM | POA: Diagnosis not present

## 2019-01-01 DIAGNOSIS — I119 Hypertensive heart disease without heart failure: Secondary | ICD-10-CM | POA: Diagnosis not present

## 2019-01-02 DIAGNOSIS — I119 Hypertensive heart disease without heart failure: Secondary | ICD-10-CM | POA: Diagnosis not present

## 2019-01-03 DIAGNOSIS — I119 Hypertensive heart disease without heart failure: Secondary | ICD-10-CM | POA: Diagnosis not present

## 2019-01-04 DIAGNOSIS — I119 Hypertensive heart disease without heart failure: Secondary | ICD-10-CM | POA: Diagnosis not present

## 2019-01-05 DIAGNOSIS — I119 Hypertensive heart disease without heart failure: Secondary | ICD-10-CM | POA: Diagnosis not present

## 2019-01-06 DIAGNOSIS — I119 Hypertensive heart disease without heart failure: Secondary | ICD-10-CM | POA: Diagnosis not present

## 2019-01-07 DIAGNOSIS — I119 Hypertensive heart disease without heart failure: Secondary | ICD-10-CM | POA: Diagnosis not present

## 2019-01-08 DIAGNOSIS — I119 Hypertensive heart disease without heart failure: Secondary | ICD-10-CM | POA: Diagnosis not present

## 2019-01-09 DIAGNOSIS — I119 Hypertensive heart disease without heart failure: Secondary | ICD-10-CM | POA: Diagnosis not present

## 2019-01-10 DIAGNOSIS — I119 Hypertensive heart disease without heart failure: Secondary | ICD-10-CM | POA: Diagnosis not present

## 2019-01-11 DIAGNOSIS — I119 Hypertensive heart disease without heart failure: Secondary | ICD-10-CM | POA: Diagnosis not present

## 2019-01-12 DIAGNOSIS — I119 Hypertensive heart disease without heart failure: Secondary | ICD-10-CM | POA: Diagnosis not present

## 2019-01-13 DIAGNOSIS — I119 Hypertensive heart disease without heart failure: Secondary | ICD-10-CM | POA: Diagnosis not present

## 2019-01-14 DIAGNOSIS — I119 Hypertensive heart disease without heart failure: Secondary | ICD-10-CM | POA: Diagnosis not present

## 2019-01-15 DIAGNOSIS — I119 Hypertensive heart disease without heart failure: Secondary | ICD-10-CM | POA: Diagnosis not present

## 2019-01-16 DIAGNOSIS — I119 Hypertensive heart disease without heart failure: Secondary | ICD-10-CM | POA: Diagnosis not present

## 2019-01-17 DIAGNOSIS — I119 Hypertensive heart disease without heart failure: Secondary | ICD-10-CM | POA: Diagnosis not present

## 2019-01-18 DIAGNOSIS — I119 Hypertensive heart disease without heart failure: Secondary | ICD-10-CM | POA: Diagnosis not present

## 2019-01-19 DIAGNOSIS — I119 Hypertensive heart disease without heart failure: Secondary | ICD-10-CM | POA: Diagnosis not present

## 2019-01-20 DIAGNOSIS — I119 Hypertensive heart disease without heart failure: Secondary | ICD-10-CM | POA: Diagnosis not present

## 2019-01-21 DIAGNOSIS — I119 Hypertensive heart disease without heart failure: Secondary | ICD-10-CM | POA: Diagnosis not present

## 2019-01-22 DIAGNOSIS — I119 Hypertensive heart disease without heart failure: Secondary | ICD-10-CM | POA: Diagnosis not present

## 2019-01-23 DIAGNOSIS — I119 Hypertensive heart disease without heart failure: Secondary | ICD-10-CM | POA: Diagnosis not present

## 2019-01-24 DIAGNOSIS — I119 Hypertensive heart disease without heart failure: Secondary | ICD-10-CM | POA: Diagnosis not present

## 2019-01-25 DIAGNOSIS — I119 Hypertensive heart disease without heart failure: Secondary | ICD-10-CM | POA: Diagnosis not present

## 2019-01-26 DIAGNOSIS — I119 Hypertensive heart disease without heart failure: Secondary | ICD-10-CM | POA: Diagnosis not present

## 2019-01-27 DIAGNOSIS — I119 Hypertensive heart disease without heart failure: Secondary | ICD-10-CM | POA: Diagnosis not present

## 2019-01-28 DIAGNOSIS — I119 Hypertensive heart disease without heart failure: Secondary | ICD-10-CM | POA: Diagnosis not present

## 2019-01-29 DIAGNOSIS — I119 Hypertensive heart disease without heart failure: Secondary | ICD-10-CM | POA: Diagnosis not present

## 2019-01-30 DIAGNOSIS — I119 Hypertensive heart disease without heart failure: Secondary | ICD-10-CM | POA: Diagnosis not present

## 2019-01-31 DIAGNOSIS — I119 Hypertensive heart disease without heart failure: Secondary | ICD-10-CM | POA: Diagnosis not present

## 2019-02-01 DIAGNOSIS — I119 Hypertensive heart disease without heart failure: Secondary | ICD-10-CM | POA: Diagnosis not present

## 2019-02-02 DIAGNOSIS — I119 Hypertensive heart disease without heart failure: Secondary | ICD-10-CM | POA: Diagnosis not present

## 2019-02-03 DIAGNOSIS — I119 Hypertensive heart disease without heart failure: Secondary | ICD-10-CM | POA: Diagnosis not present

## 2019-02-04 DIAGNOSIS — I119 Hypertensive heart disease without heart failure: Secondary | ICD-10-CM | POA: Diagnosis not present

## 2019-02-05 DIAGNOSIS — I119 Hypertensive heart disease without heart failure: Secondary | ICD-10-CM | POA: Diagnosis not present

## 2019-02-06 DIAGNOSIS — I119 Hypertensive heart disease without heart failure: Secondary | ICD-10-CM | POA: Diagnosis not present

## 2019-02-07 DIAGNOSIS — I119 Hypertensive heart disease without heart failure: Secondary | ICD-10-CM | POA: Diagnosis not present

## 2019-02-08 DIAGNOSIS — I119 Hypertensive heart disease without heart failure: Secondary | ICD-10-CM | POA: Diagnosis not present

## 2019-02-09 DIAGNOSIS — I119 Hypertensive heart disease without heart failure: Secondary | ICD-10-CM | POA: Diagnosis not present

## 2019-02-10 DIAGNOSIS — I119 Hypertensive heart disease without heart failure: Secondary | ICD-10-CM | POA: Diagnosis not present

## 2019-02-11 DIAGNOSIS — I119 Hypertensive heart disease without heart failure: Secondary | ICD-10-CM | POA: Diagnosis not present

## 2019-02-12 DIAGNOSIS — I119 Hypertensive heart disease without heart failure: Secondary | ICD-10-CM | POA: Diagnosis not present

## 2019-02-13 DIAGNOSIS — I119 Hypertensive heart disease without heart failure: Secondary | ICD-10-CM | POA: Diagnosis not present

## 2019-02-14 DIAGNOSIS — I119 Hypertensive heart disease without heart failure: Secondary | ICD-10-CM | POA: Diagnosis not present

## 2019-02-15 DIAGNOSIS — I119 Hypertensive heart disease without heart failure: Secondary | ICD-10-CM | POA: Diagnosis not present

## 2019-02-16 DIAGNOSIS — I119 Hypertensive heart disease without heart failure: Secondary | ICD-10-CM | POA: Diagnosis not present

## 2019-02-17 DIAGNOSIS — I119 Hypertensive heart disease without heart failure: Secondary | ICD-10-CM | POA: Diagnosis not present

## 2019-02-18 DIAGNOSIS — I119 Hypertensive heart disease without heart failure: Secondary | ICD-10-CM | POA: Diagnosis not present

## 2019-02-19 DIAGNOSIS — I119 Hypertensive heart disease without heart failure: Secondary | ICD-10-CM | POA: Diagnosis not present

## 2019-02-20 DIAGNOSIS — I119 Hypertensive heart disease without heart failure: Secondary | ICD-10-CM | POA: Diagnosis not present

## 2019-02-21 DIAGNOSIS — I119 Hypertensive heart disease without heart failure: Secondary | ICD-10-CM | POA: Diagnosis not present

## 2019-02-22 DIAGNOSIS — I119 Hypertensive heart disease without heart failure: Secondary | ICD-10-CM | POA: Diagnosis not present

## 2019-02-23 DIAGNOSIS — I119 Hypertensive heart disease without heart failure: Secondary | ICD-10-CM | POA: Diagnosis not present

## 2019-02-24 DIAGNOSIS — I119 Hypertensive heart disease without heart failure: Secondary | ICD-10-CM | POA: Diagnosis not present

## 2019-02-25 DIAGNOSIS — I119 Hypertensive heart disease without heart failure: Secondary | ICD-10-CM | POA: Diagnosis not present

## 2019-02-26 DIAGNOSIS — I119 Hypertensive heart disease without heart failure: Secondary | ICD-10-CM | POA: Diagnosis not present

## 2019-02-27 DIAGNOSIS — I119 Hypertensive heart disease without heart failure: Secondary | ICD-10-CM | POA: Diagnosis not present

## 2019-02-28 DIAGNOSIS — I119 Hypertensive heart disease without heart failure: Secondary | ICD-10-CM | POA: Diagnosis not present

## 2019-03-01 DIAGNOSIS — I119 Hypertensive heart disease without heart failure: Secondary | ICD-10-CM | POA: Diagnosis not present

## 2019-03-02 DIAGNOSIS — I119 Hypertensive heart disease without heart failure: Secondary | ICD-10-CM | POA: Diagnosis not present

## 2019-03-03 DIAGNOSIS — I119 Hypertensive heart disease without heart failure: Secondary | ICD-10-CM | POA: Diagnosis not present

## 2019-03-04 DIAGNOSIS — I119 Hypertensive heart disease without heart failure: Secondary | ICD-10-CM | POA: Diagnosis not present

## 2019-03-05 DIAGNOSIS — I119 Hypertensive heart disease without heart failure: Secondary | ICD-10-CM | POA: Diagnosis not present

## 2019-03-06 DIAGNOSIS — I119 Hypertensive heart disease without heart failure: Secondary | ICD-10-CM | POA: Diagnosis not present

## 2019-03-07 DIAGNOSIS — I119 Hypertensive heart disease without heart failure: Secondary | ICD-10-CM | POA: Diagnosis not present

## 2019-03-08 DIAGNOSIS — I119 Hypertensive heart disease without heart failure: Secondary | ICD-10-CM | POA: Diagnosis not present

## 2019-03-09 DIAGNOSIS — I119 Hypertensive heart disease without heart failure: Secondary | ICD-10-CM | POA: Diagnosis not present

## 2019-03-10 DIAGNOSIS — I119 Hypertensive heart disease without heart failure: Secondary | ICD-10-CM | POA: Diagnosis not present

## 2019-03-11 DIAGNOSIS — I119 Hypertensive heart disease without heart failure: Secondary | ICD-10-CM | POA: Diagnosis not present

## 2019-03-12 DIAGNOSIS — I119 Hypertensive heart disease without heart failure: Secondary | ICD-10-CM | POA: Diagnosis not present

## 2019-03-13 DIAGNOSIS — I119 Hypertensive heart disease without heart failure: Secondary | ICD-10-CM | POA: Diagnosis not present

## 2019-03-14 DIAGNOSIS — I119 Hypertensive heart disease without heart failure: Secondary | ICD-10-CM | POA: Diagnosis not present

## 2019-03-15 DIAGNOSIS — I119 Hypertensive heart disease without heart failure: Secondary | ICD-10-CM | POA: Diagnosis not present

## 2019-03-16 DIAGNOSIS — I119 Hypertensive heart disease without heart failure: Secondary | ICD-10-CM | POA: Diagnosis not present

## 2019-03-17 DIAGNOSIS — I119 Hypertensive heart disease without heart failure: Secondary | ICD-10-CM | POA: Diagnosis not present

## 2019-03-18 DIAGNOSIS — I119 Hypertensive heart disease without heart failure: Secondary | ICD-10-CM | POA: Diagnosis not present

## 2019-03-19 DIAGNOSIS — I119 Hypertensive heart disease without heart failure: Secondary | ICD-10-CM | POA: Diagnosis not present

## 2019-03-20 DIAGNOSIS — I119 Hypertensive heart disease without heart failure: Secondary | ICD-10-CM | POA: Diagnosis not present

## 2019-03-21 DIAGNOSIS — I119 Hypertensive heart disease without heart failure: Secondary | ICD-10-CM | POA: Diagnosis not present

## 2019-03-22 DIAGNOSIS — I119 Hypertensive heart disease without heart failure: Secondary | ICD-10-CM | POA: Diagnosis not present

## 2019-03-23 DIAGNOSIS — I119 Hypertensive heart disease without heart failure: Secondary | ICD-10-CM | POA: Diagnosis not present

## 2019-03-24 DIAGNOSIS — I119 Hypertensive heart disease without heart failure: Secondary | ICD-10-CM | POA: Diagnosis not present

## 2019-03-25 DIAGNOSIS — I119 Hypertensive heart disease without heart failure: Secondary | ICD-10-CM | POA: Diagnosis not present

## 2019-03-26 DIAGNOSIS — I119 Hypertensive heart disease without heart failure: Secondary | ICD-10-CM | POA: Diagnosis not present

## 2019-03-27 DIAGNOSIS — I119 Hypertensive heart disease without heart failure: Secondary | ICD-10-CM | POA: Diagnosis not present

## 2019-03-28 DIAGNOSIS — I119 Hypertensive heart disease without heart failure: Secondary | ICD-10-CM | POA: Diagnosis not present

## 2019-03-29 DIAGNOSIS — I119 Hypertensive heart disease without heart failure: Secondary | ICD-10-CM | POA: Diagnosis not present

## 2019-03-30 DIAGNOSIS — I119 Hypertensive heart disease without heart failure: Secondary | ICD-10-CM | POA: Diagnosis not present

## 2019-03-31 DIAGNOSIS — I119 Hypertensive heart disease without heart failure: Secondary | ICD-10-CM | POA: Diagnosis not present

## 2019-04-01 DIAGNOSIS — I119 Hypertensive heart disease without heart failure: Secondary | ICD-10-CM | POA: Diagnosis not present

## 2019-04-02 DIAGNOSIS — I119 Hypertensive heart disease without heart failure: Secondary | ICD-10-CM | POA: Diagnosis not present

## 2019-04-03 DIAGNOSIS — I119 Hypertensive heart disease without heart failure: Secondary | ICD-10-CM | POA: Diagnosis not present

## 2019-04-04 DIAGNOSIS — I119 Hypertensive heart disease without heart failure: Secondary | ICD-10-CM | POA: Diagnosis not present

## 2019-04-05 DIAGNOSIS — I119 Hypertensive heart disease without heart failure: Secondary | ICD-10-CM | POA: Diagnosis not present

## 2019-04-06 DIAGNOSIS — I119 Hypertensive heart disease without heart failure: Secondary | ICD-10-CM | POA: Diagnosis not present

## 2019-04-07 DIAGNOSIS — I119 Hypertensive heart disease without heart failure: Secondary | ICD-10-CM | POA: Diagnosis not present

## 2019-04-08 DIAGNOSIS — I119 Hypertensive heart disease without heart failure: Secondary | ICD-10-CM | POA: Diagnosis not present

## 2019-04-09 DIAGNOSIS — I119 Hypertensive heart disease without heart failure: Secondary | ICD-10-CM | POA: Diagnosis not present

## 2019-04-10 DIAGNOSIS — I119 Hypertensive heart disease without heart failure: Secondary | ICD-10-CM | POA: Diagnosis not present

## 2019-04-11 DIAGNOSIS — I119 Hypertensive heart disease without heart failure: Secondary | ICD-10-CM | POA: Diagnosis not present

## 2019-04-12 DIAGNOSIS — I119 Hypertensive heart disease without heart failure: Secondary | ICD-10-CM | POA: Diagnosis not present

## 2019-04-13 DIAGNOSIS — I119 Hypertensive heart disease without heart failure: Secondary | ICD-10-CM | POA: Diagnosis not present

## 2019-04-14 DIAGNOSIS — I119 Hypertensive heart disease without heart failure: Secondary | ICD-10-CM | POA: Diagnosis not present

## 2019-04-15 DIAGNOSIS — I119 Hypertensive heart disease without heart failure: Secondary | ICD-10-CM | POA: Diagnosis not present

## 2019-04-16 DIAGNOSIS — I119 Hypertensive heart disease without heart failure: Secondary | ICD-10-CM | POA: Diagnosis not present

## 2019-04-17 DIAGNOSIS — I119 Hypertensive heart disease without heart failure: Secondary | ICD-10-CM | POA: Diagnosis not present

## 2019-04-18 DIAGNOSIS — I119 Hypertensive heart disease without heart failure: Secondary | ICD-10-CM | POA: Diagnosis not present

## 2019-04-19 DIAGNOSIS — I119 Hypertensive heart disease without heart failure: Secondary | ICD-10-CM | POA: Diagnosis not present

## 2019-04-20 DIAGNOSIS — I119 Hypertensive heart disease without heart failure: Secondary | ICD-10-CM | POA: Diagnosis not present

## 2019-04-21 DIAGNOSIS — I119 Hypertensive heart disease without heart failure: Secondary | ICD-10-CM | POA: Diagnosis not present

## 2019-04-22 DIAGNOSIS — I119 Hypertensive heart disease without heart failure: Secondary | ICD-10-CM | POA: Diagnosis not present

## 2019-04-23 DIAGNOSIS — I119 Hypertensive heart disease without heart failure: Secondary | ICD-10-CM | POA: Diagnosis not present

## 2019-04-24 DIAGNOSIS — I119 Hypertensive heart disease without heart failure: Secondary | ICD-10-CM | POA: Diagnosis not present

## 2019-04-25 DIAGNOSIS — I119 Hypertensive heart disease without heart failure: Secondary | ICD-10-CM | POA: Diagnosis not present

## 2019-04-26 DIAGNOSIS — I119 Hypertensive heart disease without heart failure: Secondary | ICD-10-CM | POA: Diagnosis not present

## 2019-04-27 DIAGNOSIS — I119 Hypertensive heart disease without heart failure: Secondary | ICD-10-CM | POA: Diagnosis not present

## 2019-04-28 DIAGNOSIS — I119 Hypertensive heart disease without heart failure: Secondary | ICD-10-CM | POA: Diagnosis not present

## 2019-04-29 DIAGNOSIS — I119 Hypertensive heart disease without heart failure: Secondary | ICD-10-CM | POA: Diagnosis not present

## 2019-04-30 DIAGNOSIS — I119 Hypertensive heart disease without heart failure: Secondary | ICD-10-CM | POA: Diagnosis not present

## 2019-05-01 DIAGNOSIS — I119 Hypertensive heart disease without heart failure: Secondary | ICD-10-CM | POA: Diagnosis not present

## 2019-05-02 DIAGNOSIS — I119 Hypertensive heart disease without heart failure: Secondary | ICD-10-CM | POA: Diagnosis not present

## 2019-05-03 DIAGNOSIS — I119 Hypertensive heart disease without heart failure: Secondary | ICD-10-CM | POA: Diagnosis not present

## 2019-05-04 DIAGNOSIS — I119 Hypertensive heart disease without heart failure: Secondary | ICD-10-CM | POA: Diagnosis not present

## 2019-05-05 DIAGNOSIS — I119 Hypertensive heart disease without heart failure: Secondary | ICD-10-CM | POA: Diagnosis not present

## 2019-05-06 DIAGNOSIS — I119 Hypertensive heart disease without heart failure: Secondary | ICD-10-CM | POA: Diagnosis not present

## 2019-05-07 DIAGNOSIS — I119 Hypertensive heart disease without heart failure: Secondary | ICD-10-CM | POA: Diagnosis not present

## 2019-05-08 DIAGNOSIS — I119 Hypertensive heart disease without heart failure: Secondary | ICD-10-CM | POA: Diagnosis not present

## 2019-05-09 DIAGNOSIS — I119 Hypertensive heart disease without heart failure: Secondary | ICD-10-CM | POA: Diagnosis not present

## 2019-05-10 DIAGNOSIS — I119 Hypertensive heart disease without heart failure: Secondary | ICD-10-CM | POA: Diagnosis not present

## 2019-05-11 DIAGNOSIS — I119 Hypertensive heart disease without heart failure: Secondary | ICD-10-CM | POA: Diagnosis not present

## 2019-05-12 DIAGNOSIS — I119 Hypertensive heart disease without heart failure: Secondary | ICD-10-CM | POA: Diagnosis not present

## 2019-05-13 DIAGNOSIS — I119 Hypertensive heart disease without heart failure: Secondary | ICD-10-CM | POA: Diagnosis not present

## 2019-05-14 DIAGNOSIS — I119 Hypertensive heart disease without heart failure: Secondary | ICD-10-CM | POA: Diagnosis not present

## 2019-05-15 DIAGNOSIS — I119 Hypertensive heart disease without heart failure: Secondary | ICD-10-CM | POA: Diagnosis not present

## 2019-05-16 DIAGNOSIS — I119 Hypertensive heart disease without heart failure: Secondary | ICD-10-CM | POA: Diagnosis not present

## 2019-05-17 DIAGNOSIS — I119 Hypertensive heart disease without heart failure: Secondary | ICD-10-CM | POA: Diagnosis not present

## 2019-05-18 DIAGNOSIS — I119 Hypertensive heart disease without heart failure: Secondary | ICD-10-CM | POA: Diagnosis not present

## 2019-05-19 DIAGNOSIS — I119 Hypertensive heart disease without heart failure: Secondary | ICD-10-CM | POA: Diagnosis not present

## 2019-05-20 DIAGNOSIS — I119 Hypertensive heart disease without heart failure: Secondary | ICD-10-CM | POA: Diagnosis not present

## 2019-05-21 DIAGNOSIS — I119 Hypertensive heart disease without heart failure: Secondary | ICD-10-CM | POA: Diagnosis not present

## 2019-05-22 DIAGNOSIS — I119 Hypertensive heart disease without heart failure: Secondary | ICD-10-CM | POA: Diagnosis not present

## 2019-05-23 DIAGNOSIS — I119 Hypertensive heart disease without heart failure: Secondary | ICD-10-CM | POA: Diagnosis not present

## 2019-05-24 DIAGNOSIS — I119 Hypertensive heart disease without heart failure: Secondary | ICD-10-CM | POA: Diagnosis not present

## 2019-05-25 DIAGNOSIS — I119 Hypertensive heart disease without heart failure: Secondary | ICD-10-CM | POA: Diagnosis not present

## 2019-05-26 DIAGNOSIS — I119 Hypertensive heart disease without heart failure: Secondary | ICD-10-CM | POA: Diagnosis not present

## 2019-05-27 DIAGNOSIS — I119 Hypertensive heart disease without heart failure: Secondary | ICD-10-CM | POA: Diagnosis not present

## 2019-05-28 DIAGNOSIS — I119 Hypertensive heart disease without heart failure: Secondary | ICD-10-CM | POA: Diagnosis not present

## 2019-05-29 DIAGNOSIS — I119 Hypertensive heart disease without heart failure: Secondary | ICD-10-CM | POA: Diagnosis not present

## 2019-05-30 DIAGNOSIS — I119 Hypertensive heart disease without heart failure: Secondary | ICD-10-CM | POA: Diagnosis not present

## 2019-05-31 DIAGNOSIS — I119 Hypertensive heart disease without heart failure: Secondary | ICD-10-CM | POA: Diagnosis not present

## 2019-06-01 DIAGNOSIS — I119 Hypertensive heart disease without heart failure: Secondary | ICD-10-CM | POA: Diagnosis not present

## 2019-06-02 DIAGNOSIS — I119 Hypertensive heart disease without heart failure: Secondary | ICD-10-CM | POA: Diagnosis not present

## 2019-06-03 DIAGNOSIS — I119 Hypertensive heart disease without heart failure: Secondary | ICD-10-CM | POA: Diagnosis not present

## 2019-06-04 DIAGNOSIS — I119 Hypertensive heart disease without heart failure: Secondary | ICD-10-CM | POA: Diagnosis not present

## 2019-06-05 DIAGNOSIS — I119 Hypertensive heart disease without heart failure: Secondary | ICD-10-CM | POA: Diagnosis not present

## 2019-06-06 DIAGNOSIS — I119 Hypertensive heart disease without heart failure: Secondary | ICD-10-CM | POA: Diagnosis not present

## 2019-06-07 DIAGNOSIS — I119 Hypertensive heart disease without heart failure: Secondary | ICD-10-CM | POA: Diagnosis not present

## 2019-06-08 DIAGNOSIS — I119 Hypertensive heart disease without heart failure: Secondary | ICD-10-CM | POA: Diagnosis not present

## 2019-06-09 DIAGNOSIS — I119 Hypertensive heart disease without heart failure: Secondary | ICD-10-CM | POA: Diagnosis not present

## 2019-06-10 DIAGNOSIS — I119 Hypertensive heart disease without heart failure: Secondary | ICD-10-CM | POA: Diagnosis not present

## 2019-06-11 DIAGNOSIS — I119 Hypertensive heart disease without heart failure: Secondary | ICD-10-CM | POA: Diagnosis not present

## 2019-06-12 DIAGNOSIS — I119 Hypertensive heart disease without heart failure: Secondary | ICD-10-CM | POA: Diagnosis not present

## 2019-06-13 DIAGNOSIS — I119 Hypertensive heart disease without heart failure: Secondary | ICD-10-CM | POA: Diagnosis not present

## 2019-06-14 DIAGNOSIS — I119 Hypertensive heart disease without heart failure: Secondary | ICD-10-CM | POA: Diagnosis not present

## 2019-06-15 DIAGNOSIS — I119 Hypertensive heart disease without heart failure: Secondary | ICD-10-CM | POA: Diagnosis not present

## 2019-06-16 DIAGNOSIS — I119 Hypertensive heart disease without heart failure: Secondary | ICD-10-CM | POA: Diagnosis not present

## 2019-06-17 DIAGNOSIS — I119 Hypertensive heart disease without heart failure: Secondary | ICD-10-CM | POA: Diagnosis not present

## 2019-06-18 DIAGNOSIS — I119 Hypertensive heart disease without heart failure: Secondary | ICD-10-CM | POA: Diagnosis not present

## 2019-06-19 DIAGNOSIS — I119 Hypertensive heart disease without heart failure: Secondary | ICD-10-CM | POA: Diagnosis not present

## 2019-06-20 DIAGNOSIS — I119 Hypertensive heart disease without heart failure: Secondary | ICD-10-CM | POA: Diagnosis not present

## 2019-06-21 DIAGNOSIS — I119 Hypertensive heart disease without heart failure: Secondary | ICD-10-CM | POA: Diagnosis not present

## 2019-06-22 DIAGNOSIS — I119 Hypertensive heart disease without heart failure: Secondary | ICD-10-CM | POA: Diagnosis not present

## 2019-06-23 DIAGNOSIS — I119 Hypertensive heart disease without heart failure: Secondary | ICD-10-CM | POA: Diagnosis not present

## 2019-06-24 DIAGNOSIS — I119 Hypertensive heart disease without heart failure: Secondary | ICD-10-CM | POA: Diagnosis not present

## 2019-06-25 DIAGNOSIS — I119 Hypertensive heart disease without heart failure: Secondary | ICD-10-CM | POA: Diagnosis not present

## 2019-06-26 DIAGNOSIS — I119 Hypertensive heart disease without heart failure: Secondary | ICD-10-CM | POA: Diagnosis not present

## 2019-06-27 DIAGNOSIS — I119 Hypertensive heart disease without heart failure: Secondary | ICD-10-CM | POA: Diagnosis not present

## 2019-06-28 DIAGNOSIS — I119 Hypertensive heart disease without heart failure: Secondary | ICD-10-CM | POA: Diagnosis not present

## 2019-06-29 DIAGNOSIS — I119 Hypertensive heart disease without heart failure: Secondary | ICD-10-CM | POA: Diagnosis not present

## 2019-06-30 DIAGNOSIS — I119 Hypertensive heart disease without heart failure: Secondary | ICD-10-CM | POA: Diagnosis not present

## 2019-07-01 DIAGNOSIS — I119 Hypertensive heart disease without heart failure: Secondary | ICD-10-CM | POA: Diagnosis not present

## 2019-07-02 DIAGNOSIS — I119 Hypertensive heart disease without heart failure: Secondary | ICD-10-CM | POA: Diagnosis not present

## 2019-07-03 DIAGNOSIS — I119 Hypertensive heart disease without heart failure: Secondary | ICD-10-CM | POA: Diagnosis not present

## 2019-07-04 DIAGNOSIS — I119 Hypertensive heart disease without heart failure: Secondary | ICD-10-CM | POA: Diagnosis not present

## 2019-07-05 DIAGNOSIS — I119 Hypertensive heart disease without heart failure: Secondary | ICD-10-CM | POA: Diagnosis not present

## 2019-07-06 DIAGNOSIS — I119 Hypertensive heart disease without heart failure: Secondary | ICD-10-CM | POA: Diagnosis not present

## 2019-07-07 DIAGNOSIS — I119 Hypertensive heart disease without heart failure: Secondary | ICD-10-CM | POA: Diagnosis not present

## 2019-07-08 DIAGNOSIS — I119 Hypertensive heart disease without heart failure: Secondary | ICD-10-CM | POA: Diagnosis not present

## 2019-07-09 DIAGNOSIS — I119 Hypertensive heart disease without heart failure: Secondary | ICD-10-CM | POA: Diagnosis not present

## 2019-07-10 DIAGNOSIS — I119 Hypertensive heart disease without heart failure: Secondary | ICD-10-CM | POA: Diagnosis not present

## 2019-07-11 DIAGNOSIS — I119 Hypertensive heart disease without heart failure: Secondary | ICD-10-CM | POA: Diagnosis not present

## 2019-07-12 DIAGNOSIS — I119 Hypertensive heart disease without heart failure: Secondary | ICD-10-CM | POA: Diagnosis not present

## 2019-07-13 DIAGNOSIS — I119 Hypertensive heart disease without heart failure: Secondary | ICD-10-CM | POA: Diagnosis not present

## 2019-07-14 DIAGNOSIS — I119 Hypertensive heart disease without heart failure: Secondary | ICD-10-CM | POA: Diagnosis not present

## 2019-07-15 DIAGNOSIS — I119 Hypertensive heart disease without heart failure: Secondary | ICD-10-CM | POA: Diagnosis not present

## 2019-07-16 DIAGNOSIS — I119 Hypertensive heart disease without heart failure: Secondary | ICD-10-CM | POA: Diagnosis not present

## 2019-07-17 DIAGNOSIS — I119 Hypertensive heart disease without heart failure: Secondary | ICD-10-CM | POA: Diagnosis not present

## 2019-07-18 DIAGNOSIS — I119 Hypertensive heart disease without heart failure: Secondary | ICD-10-CM | POA: Diagnosis not present

## 2019-07-19 DIAGNOSIS — I119 Hypertensive heart disease without heart failure: Secondary | ICD-10-CM | POA: Diagnosis not present

## 2019-07-20 DIAGNOSIS — I119 Hypertensive heart disease without heart failure: Secondary | ICD-10-CM | POA: Diagnosis not present

## 2019-07-21 DIAGNOSIS — I119 Hypertensive heart disease without heart failure: Secondary | ICD-10-CM | POA: Diagnosis not present

## 2019-07-22 DIAGNOSIS — I119 Hypertensive heart disease without heart failure: Secondary | ICD-10-CM | POA: Diagnosis not present

## 2019-07-23 DIAGNOSIS — I119 Hypertensive heart disease without heart failure: Secondary | ICD-10-CM | POA: Diagnosis not present

## 2019-07-24 DIAGNOSIS — I119 Hypertensive heart disease without heart failure: Secondary | ICD-10-CM | POA: Diagnosis not present

## 2019-07-25 DIAGNOSIS — I119 Hypertensive heart disease without heart failure: Secondary | ICD-10-CM | POA: Diagnosis not present

## 2019-07-26 DIAGNOSIS — I119 Hypertensive heart disease without heart failure: Secondary | ICD-10-CM | POA: Diagnosis not present

## 2019-07-27 DIAGNOSIS — I119 Hypertensive heart disease without heart failure: Secondary | ICD-10-CM | POA: Diagnosis not present

## 2019-07-28 DIAGNOSIS — I119 Hypertensive heart disease without heart failure: Secondary | ICD-10-CM | POA: Diagnosis not present

## 2019-07-29 DIAGNOSIS — I119 Hypertensive heart disease without heart failure: Secondary | ICD-10-CM | POA: Diagnosis not present

## 2019-07-30 DIAGNOSIS — I119 Hypertensive heart disease without heart failure: Secondary | ICD-10-CM | POA: Diagnosis not present

## 2019-07-31 DIAGNOSIS — I119 Hypertensive heart disease without heart failure: Secondary | ICD-10-CM | POA: Diagnosis not present

## 2019-08-01 DIAGNOSIS — I119 Hypertensive heart disease without heart failure: Secondary | ICD-10-CM | POA: Diagnosis not present

## 2019-08-02 DIAGNOSIS — I119 Hypertensive heart disease without heart failure: Secondary | ICD-10-CM | POA: Diagnosis not present

## 2019-08-03 DIAGNOSIS — I119 Hypertensive heart disease without heart failure: Secondary | ICD-10-CM | POA: Diagnosis not present

## 2019-08-04 DIAGNOSIS — I119 Hypertensive heart disease without heart failure: Secondary | ICD-10-CM | POA: Diagnosis not present

## 2019-08-05 DIAGNOSIS — I119 Hypertensive heart disease without heart failure: Secondary | ICD-10-CM | POA: Diagnosis not present

## 2019-08-06 DIAGNOSIS — I119 Hypertensive heart disease without heart failure: Secondary | ICD-10-CM | POA: Diagnosis not present

## 2019-08-07 DIAGNOSIS — I119 Hypertensive heart disease without heart failure: Secondary | ICD-10-CM | POA: Diagnosis not present

## 2019-08-08 DIAGNOSIS — I119 Hypertensive heart disease without heart failure: Secondary | ICD-10-CM | POA: Diagnosis not present

## 2019-08-09 DIAGNOSIS — I119 Hypertensive heart disease without heart failure: Secondary | ICD-10-CM | POA: Diagnosis not present

## 2019-08-10 DIAGNOSIS — I119 Hypertensive heart disease without heart failure: Secondary | ICD-10-CM | POA: Diagnosis not present

## 2019-08-11 DIAGNOSIS — I119 Hypertensive heart disease without heart failure: Secondary | ICD-10-CM | POA: Diagnosis not present

## 2019-08-12 DIAGNOSIS — I119 Hypertensive heart disease without heart failure: Secondary | ICD-10-CM | POA: Diagnosis not present

## 2019-08-13 DIAGNOSIS — I119 Hypertensive heart disease without heart failure: Secondary | ICD-10-CM | POA: Diagnosis not present

## 2019-08-14 DIAGNOSIS — I119 Hypertensive heart disease without heart failure: Secondary | ICD-10-CM | POA: Diagnosis not present

## 2019-08-15 DIAGNOSIS — I119 Hypertensive heart disease without heart failure: Secondary | ICD-10-CM | POA: Diagnosis not present

## 2019-08-16 DIAGNOSIS — I119 Hypertensive heart disease without heart failure: Secondary | ICD-10-CM | POA: Diagnosis not present

## 2019-08-17 DIAGNOSIS — I119 Hypertensive heart disease without heart failure: Secondary | ICD-10-CM | POA: Diagnosis not present

## 2019-08-18 DIAGNOSIS — I119 Hypertensive heart disease without heart failure: Secondary | ICD-10-CM | POA: Diagnosis not present

## 2019-08-19 DIAGNOSIS — I119 Hypertensive heart disease without heart failure: Secondary | ICD-10-CM | POA: Diagnosis not present

## 2019-08-20 DIAGNOSIS — I119 Hypertensive heart disease without heart failure: Secondary | ICD-10-CM | POA: Diagnosis not present

## 2019-08-21 DIAGNOSIS — I119 Hypertensive heart disease without heart failure: Secondary | ICD-10-CM | POA: Diagnosis not present

## 2019-08-22 DIAGNOSIS — I119 Hypertensive heart disease without heart failure: Secondary | ICD-10-CM | POA: Diagnosis not present

## 2019-08-23 DIAGNOSIS — I119 Hypertensive heart disease without heart failure: Secondary | ICD-10-CM | POA: Diagnosis not present

## 2019-08-24 DIAGNOSIS — I119 Hypertensive heart disease without heart failure: Secondary | ICD-10-CM | POA: Diagnosis not present

## 2019-08-25 DIAGNOSIS — I119 Hypertensive heart disease without heart failure: Secondary | ICD-10-CM | POA: Diagnosis not present

## 2019-08-26 DIAGNOSIS — I119 Hypertensive heart disease without heart failure: Secondary | ICD-10-CM | POA: Diagnosis not present

## 2019-08-27 DIAGNOSIS — I119 Hypertensive heart disease without heart failure: Secondary | ICD-10-CM | POA: Diagnosis not present

## 2019-08-28 DIAGNOSIS — I119 Hypertensive heart disease without heart failure: Secondary | ICD-10-CM | POA: Diagnosis not present

## 2019-08-29 DIAGNOSIS — I119 Hypertensive heart disease without heart failure: Secondary | ICD-10-CM | POA: Diagnosis not present

## 2019-08-30 DIAGNOSIS — I119 Hypertensive heart disease without heart failure: Secondary | ICD-10-CM | POA: Diagnosis not present

## 2019-08-31 DIAGNOSIS — I119 Hypertensive heart disease without heart failure: Secondary | ICD-10-CM | POA: Diagnosis not present

## 2019-09-01 DIAGNOSIS — I119 Hypertensive heart disease without heart failure: Secondary | ICD-10-CM | POA: Diagnosis not present

## 2019-09-02 DIAGNOSIS — I119 Hypertensive heart disease without heart failure: Secondary | ICD-10-CM | POA: Diagnosis not present

## 2019-09-03 DIAGNOSIS — I119 Hypertensive heart disease without heart failure: Secondary | ICD-10-CM | POA: Diagnosis not present

## 2019-09-04 DIAGNOSIS — I119 Hypertensive heart disease without heart failure: Secondary | ICD-10-CM | POA: Diagnosis not present

## 2019-09-05 DIAGNOSIS — I119 Hypertensive heart disease without heart failure: Secondary | ICD-10-CM | POA: Diagnosis not present

## 2019-09-06 DIAGNOSIS — I119 Hypertensive heart disease without heart failure: Secondary | ICD-10-CM | POA: Diagnosis not present

## 2019-09-07 DIAGNOSIS — I119 Hypertensive heart disease without heart failure: Secondary | ICD-10-CM | POA: Diagnosis not present

## 2019-09-08 DIAGNOSIS — I119 Hypertensive heart disease without heart failure: Secondary | ICD-10-CM | POA: Diagnosis not present

## 2019-09-09 DIAGNOSIS — I119 Hypertensive heart disease without heart failure: Secondary | ICD-10-CM | POA: Diagnosis not present

## 2019-09-10 DIAGNOSIS — I119 Hypertensive heart disease without heart failure: Secondary | ICD-10-CM | POA: Diagnosis not present

## 2019-09-11 DIAGNOSIS — I119 Hypertensive heart disease without heart failure: Secondary | ICD-10-CM | POA: Diagnosis not present

## 2019-09-12 DIAGNOSIS — I119 Hypertensive heart disease without heart failure: Secondary | ICD-10-CM | POA: Diagnosis not present

## 2019-09-13 DIAGNOSIS — I119 Hypertensive heart disease without heart failure: Secondary | ICD-10-CM | POA: Diagnosis not present

## 2019-09-14 DIAGNOSIS — I119 Hypertensive heart disease without heart failure: Secondary | ICD-10-CM | POA: Diagnosis not present

## 2019-09-15 DIAGNOSIS — I119 Hypertensive heart disease without heart failure: Secondary | ICD-10-CM | POA: Diagnosis not present

## 2019-09-16 DIAGNOSIS — I119 Hypertensive heart disease without heart failure: Secondary | ICD-10-CM | POA: Diagnosis not present

## 2019-09-17 DIAGNOSIS — I119 Hypertensive heart disease without heart failure: Secondary | ICD-10-CM | POA: Diagnosis not present

## 2019-09-18 DIAGNOSIS — I119 Hypertensive heart disease without heart failure: Secondary | ICD-10-CM | POA: Diagnosis not present

## 2019-09-19 DIAGNOSIS — I119 Hypertensive heart disease without heart failure: Secondary | ICD-10-CM | POA: Diagnosis not present

## 2019-09-20 DIAGNOSIS — I119 Hypertensive heart disease without heart failure: Secondary | ICD-10-CM | POA: Diagnosis not present

## 2019-09-21 DIAGNOSIS — I119 Hypertensive heart disease without heart failure: Secondary | ICD-10-CM | POA: Diagnosis not present

## 2019-09-22 DIAGNOSIS — I119 Hypertensive heart disease without heart failure: Secondary | ICD-10-CM | POA: Diagnosis not present

## 2019-09-23 DIAGNOSIS — I119 Hypertensive heart disease without heart failure: Secondary | ICD-10-CM | POA: Diagnosis not present

## 2019-09-24 DIAGNOSIS — I119 Hypertensive heart disease without heart failure: Secondary | ICD-10-CM | POA: Diagnosis not present

## 2019-09-25 DIAGNOSIS — I119 Hypertensive heart disease without heart failure: Secondary | ICD-10-CM | POA: Diagnosis not present

## 2019-09-26 DIAGNOSIS — I119 Hypertensive heart disease without heart failure: Secondary | ICD-10-CM | POA: Diagnosis not present

## 2019-09-27 DIAGNOSIS — I119 Hypertensive heart disease without heart failure: Secondary | ICD-10-CM | POA: Diagnosis not present

## 2019-09-28 DIAGNOSIS — I119 Hypertensive heart disease without heart failure: Secondary | ICD-10-CM | POA: Diagnosis not present

## 2019-09-29 DIAGNOSIS — I119 Hypertensive heart disease without heart failure: Secondary | ICD-10-CM | POA: Diagnosis not present

## 2019-09-30 DIAGNOSIS — I119 Hypertensive heart disease without heart failure: Secondary | ICD-10-CM | POA: Diagnosis not present

## 2019-10-01 DIAGNOSIS — I119 Hypertensive heart disease without heart failure: Secondary | ICD-10-CM | POA: Diagnosis not present

## 2019-10-02 DIAGNOSIS — I119 Hypertensive heart disease without heart failure: Secondary | ICD-10-CM | POA: Diagnosis not present

## 2019-10-03 DIAGNOSIS — I119 Hypertensive heart disease without heart failure: Secondary | ICD-10-CM | POA: Diagnosis not present

## 2019-10-04 DIAGNOSIS — I119 Hypertensive heart disease without heart failure: Secondary | ICD-10-CM | POA: Diagnosis not present

## 2019-10-05 DIAGNOSIS — I119 Hypertensive heart disease without heart failure: Secondary | ICD-10-CM | POA: Diagnosis not present

## 2019-10-06 DIAGNOSIS — I119 Hypertensive heart disease without heart failure: Secondary | ICD-10-CM | POA: Diagnosis not present

## 2019-10-07 DIAGNOSIS — I119 Hypertensive heart disease without heart failure: Secondary | ICD-10-CM | POA: Diagnosis not present

## 2019-10-08 DIAGNOSIS — I119 Hypertensive heart disease without heart failure: Secondary | ICD-10-CM | POA: Diagnosis not present

## 2019-10-09 DIAGNOSIS — I119 Hypertensive heart disease without heart failure: Secondary | ICD-10-CM | POA: Diagnosis not present

## 2019-10-10 DIAGNOSIS — I119 Hypertensive heart disease without heart failure: Secondary | ICD-10-CM | POA: Diagnosis not present

## 2019-10-11 DIAGNOSIS — I119 Hypertensive heart disease without heart failure: Secondary | ICD-10-CM | POA: Diagnosis not present

## 2019-10-12 DIAGNOSIS — I119 Hypertensive heart disease without heart failure: Secondary | ICD-10-CM | POA: Diagnosis not present

## 2019-10-13 DIAGNOSIS — I119 Hypertensive heart disease without heart failure: Secondary | ICD-10-CM | POA: Diagnosis not present

## 2019-10-14 DIAGNOSIS — I119 Hypertensive heart disease without heart failure: Secondary | ICD-10-CM | POA: Diagnosis not present

## 2019-10-15 DIAGNOSIS — I119 Hypertensive heart disease without heart failure: Secondary | ICD-10-CM | POA: Diagnosis not present

## 2019-10-16 DIAGNOSIS — I119 Hypertensive heart disease without heart failure: Secondary | ICD-10-CM | POA: Diagnosis not present

## 2019-10-17 DIAGNOSIS — I119 Hypertensive heart disease without heart failure: Secondary | ICD-10-CM | POA: Diagnosis not present

## 2019-10-18 DIAGNOSIS — I119 Hypertensive heart disease without heart failure: Secondary | ICD-10-CM | POA: Diagnosis not present

## 2019-10-19 DIAGNOSIS — I119 Hypertensive heart disease without heart failure: Secondary | ICD-10-CM | POA: Diagnosis not present

## 2019-10-20 DIAGNOSIS — I119 Hypertensive heart disease without heart failure: Secondary | ICD-10-CM | POA: Diagnosis not present

## 2019-10-21 DIAGNOSIS — I119 Hypertensive heart disease without heart failure: Secondary | ICD-10-CM | POA: Diagnosis not present

## 2019-10-22 DIAGNOSIS — I119 Hypertensive heart disease without heart failure: Secondary | ICD-10-CM | POA: Diagnosis not present

## 2019-10-23 DIAGNOSIS — I119 Hypertensive heart disease without heart failure: Secondary | ICD-10-CM | POA: Diagnosis not present

## 2019-10-24 DIAGNOSIS — I119 Hypertensive heart disease without heart failure: Secondary | ICD-10-CM | POA: Diagnosis not present

## 2019-10-25 DIAGNOSIS — I119 Hypertensive heart disease without heart failure: Secondary | ICD-10-CM | POA: Diagnosis not present

## 2019-10-26 DIAGNOSIS — I119 Hypertensive heart disease without heart failure: Secondary | ICD-10-CM | POA: Diagnosis not present

## 2019-10-27 DIAGNOSIS — I119 Hypertensive heart disease without heart failure: Secondary | ICD-10-CM | POA: Diagnosis not present

## 2019-10-28 DIAGNOSIS — I119 Hypertensive heart disease without heart failure: Secondary | ICD-10-CM | POA: Diagnosis not present

## 2019-10-29 DIAGNOSIS — I119 Hypertensive heart disease without heart failure: Secondary | ICD-10-CM | POA: Diagnosis not present

## 2019-10-30 DIAGNOSIS — I119 Hypertensive heart disease without heart failure: Secondary | ICD-10-CM | POA: Diagnosis not present

## 2019-10-31 DIAGNOSIS — I119 Hypertensive heart disease without heart failure: Secondary | ICD-10-CM | POA: Diagnosis not present

## 2019-11-01 DIAGNOSIS — I119 Hypertensive heart disease without heart failure: Secondary | ICD-10-CM | POA: Diagnosis not present

## 2019-11-02 DIAGNOSIS — I119 Hypertensive heart disease without heart failure: Secondary | ICD-10-CM | POA: Diagnosis not present

## 2019-11-03 DIAGNOSIS — I119 Hypertensive heart disease without heart failure: Secondary | ICD-10-CM | POA: Diagnosis not present

## 2019-11-04 DIAGNOSIS — I119 Hypertensive heart disease without heart failure: Secondary | ICD-10-CM | POA: Diagnosis not present

## 2019-11-05 DIAGNOSIS — I119 Hypertensive heart disease without heart failure: Secondary | ICD-10-CM | POA: Diagnosis not present

## 2019-11-06 DIAGNOSIS — I119 Hypertensive heart disease without heart failure: Secondary | ICD-10-CM | POA: Diagnosis not present

## 2019-11-07 DIAGNOSIS — I119 Hypertensive heart disease without heart failure: Secondary | ICD-10-CM | POA: Diagnosis not present

## 2019-11-08 DIAGNOSIS — I119 Hypertensive heart disease without heart failure: Secondary | ICD-10-CM | POA: Diagnosis not present

## 2019-11-09 DIAGNOSIS — I119 Hypertensive heart disease without heart failure: Secondary | ICD-10-CM | POA: Diagnosis not present

## 2019-11-10 DIAGNOSIS — I119 Hypertensive heart disease without heart failure: Secondary | ICD-10-CM | POA: Diagnosis not present

## 2019-11-11 DIAGNOSIS — I119 Hypertensive heart disease without heart failure: Secondary | ICD-10-CM | POA: Diagnosis not present

## 2019-11-12 DIAGNOSIS — I119 Hypertensive heart disease without heart failure: Secondary | ICD-10-CM | POA: Diagnosis not present

## 2019-11-13 DIAGNOSIS — I119 Hypertensive heart disease without heart failure: Secondary | ICD-10-CM | POA: Diagnosis not present

## 2019-11-14 DIAGNOSIS — I119 Hypertensive heart disease without heart failure: Secondary | ICD-10-CM | POA: Diagnosis not present

## 2019-11-15 DIAGNOSIS — I119 Hypertensive heart disease without heart failure: Secondary | ICD-10-CM | POA: Diagnosis not present

## 2019-11-16 DIAGNOSIS — I119 Hypertensive heart disease without heart failure: Secondary | ICD-10-CM | POA: Diagnosis not present

## 2019-11-17 DIAGNOSIS — I119 Hypertensive heart disease without heart failure: Secondary | ICD-10-CM | POA: Diagnosis not present

## 2019-11-18 DIAGNOSIS — I119 Hypertensive heart disease without heart failure: Secondary | ICD-10-CM | POA: Diagnosis not present

## 2019-11-19 DIAGNOSIS — I119 Hypertensive heart disease without heart failure: Secondary | ICD-10-CM | POA: Diagnosis not present

## 2019-11-20 DIAGNOSIS — I119 Hypertensive heart disease without heart failure: Secondary | ICD-10-CM | POA: Diagnosis not present

## 2019-11-21 DIAGNOSIS — I119 Hypertensive heart disease without heart failure: Secondary | ICD-10-CM | POA: Diagnosis not present

## 2019-11-22 DIAGNOSIS — I119 Hypertensive heart disease without heart failure: Secondary | ICD-10-CM | POA: Diagnosis not present

## 2019-11-23 DIAGNOSIS — I119 Hypertensive heart disease without heart failure: Secondary | ICD-10-CM | POA: Diagnosis not present

## 2019-11-24 DIAGNOSIS — I119 Hypertensive heart disease without heart failure: Secondary | ICD-10-CM | POA: Diagnosis not present

## 2019-11-25 DIAGNOSIS — I119 Hypertensive heart disease without heart failure: Secondary | ICD-10-CM | POA: Diagnosis not present

## 2019-11-26 DIAGNOSIS — I119 Hypertensive heart disease without heart failure: Secondary | ICD-10-CM | POA: Diagnosis not present

## 2019-11-27 DIAGNOSIS — I119 Hypertensive heart disease without heart failure: Secondary | ICD-10-CM | POA: Diagnosis not present

## 2019-11-28 DIAGNOSIS — I119 Hypertensive heart disease without heart failure: Secondary | ICD-10-CM | POA: Diagnosis not present

## 2019-11-29 DIAGNOSIS — I119 Hypertensive heart disease without heart failure: Secondary | ICD-10-CM | POA: Diagnosis not present

## 2019-11-30 DIAGNOSIS — I119 Hypertensive heart disease without heart failure: Secondary | ICD-10-CM | POA: Diagnosis not present

## 2019-12-01 DIAGNOSIS — I119 Hypertensive heart disease without heart failure: Secondary | ICD-10-CM | POA: Diagnosis not present

## 2019-12-02 DIAGNOSIS — I119 Hypertensive heart disease without heart failure: Secondary | ICD-10-CM | POA: Diagnosis not present

## 2019-12-03 DIAGNOSIS — I119 Hypertensive heart disease without heart failure: Secondary | ICD-10-CM | POA: Diagnosis not present

## 2019-12-04 DIAGNOSIS — I119 Hypertensive heart disease without heart failure: Secondary | ICD-10-CM | POA: Diagnosis not present

## 2019-12-05 DIAGNOSIS — I119 Hypertensive heart disease without heart failure: Secondary | ICD-10-CM | POA: Diagnosis not present

## 2019-12-06 DIAGNOSIS — I119 Hypertensive heart disease without heart failure: Secondary | ICD-10-CM | POA: Diagnosis not present

## 2019-12-07 DIAGNOSIS — I119 Hypertensive heart disease without heart failure: Secondary | ICD-10-CM | POA: Diagnosis not present

## 2019-12-08 DIAGNOSIS — I119 Hypertensive heart disease without heart failure: Secondary | ICD-10-CM | POA: Diagnosis not present

## 2019-12-09 DIAGNOSIS — I119 Hypertensive heart disease without heart failure: Secondary | ICD-10-CM | POA: Diagnosis not present

## 2019-12-10 DIAGNOSIS — I119 Hypertensive heart disease without heart failure: Secondary | ICD-10-CM | POA: Diagnosis not present

## 2019-12-11 DIAGNOSIS — I119 Hypertensive heart disease without heart failure: Secondary | ICD-10-CM | POA: Diagnosis not present

## 2019-12-12 DIAGNOSIS — I119 Hypertensive heart disease without heart failure: Secondary | ICD-10-CM | POA: Diagnosis not present

## 2019-12-13 DIAGNOSIS — I119 Hypertensive heart disease without heart failure: Secondary | ICD-10-CM | POA: Diagnosis not present

## 2019-12-14 DIAGNOSIS — I119 Hypertensive heart disease without heart failure: Secondary | ICD-10-CM | POA: Diagnosis not present

## 2019-12-15 DIAGNOSIS — I119 Hypertensive heart disease without heart failure: Secondary | ICD-10-CM | POA: Diagnosis not present

## 2019-12-16 DIAGNOSIS — I119 Hypertensive heart disease without heart failure: Secondary | ICD-10-CM | POA: Diagnosis not present

## 2019-12-17 DIAGNOSIS — I119 Hypertensive heart disease without heart failure: Secondary | ICD-10-CM | POA: Diagnosis not present

## 2019-12-18 DIAGNOSIS — I119 Hypertensive heart disease without heart failure: Secondary | ICD-10-CM | POA: Diagnosis not present

## 2019-12-19 DIAGNOSIS — I119 Hypertensive heart disease without heart failure: Secondary | ICD-10-CM | POA: Diagnosis not present

## 2019-12-20 DIAGNOSIS — I119 Hypertensive heart disease without heart failure: Secondary | ICD-10-CM | POA: Diagnosis not present

## 2019-12-21 DIAGNOSIS — I119 Hypertensive heart disease without heart failure: Secondary | ICD-10-CM | POA: Diagnosis not present

## 2019-12-22 DIAGNOSIS — I119 Hypertensive heart disease without heart failure: Secondary | ICD-10-CM | POA: Diagnosis not present

## 2019-12-23 DIAGNOSIS — I119 Hypertensive heart disease without heart failure: Secondary | ICD-10-CM | POA: Diagnosis not present

## 2019-12-24 DIAGNOSIS — I119 Hypertensive heart disease without heart failure: Secondary | ICD-10-CM | POA: Diagnosis not present

## 2019-12-25 DIAGNOSIS — I119 Hypertensive heart disease without heart failure: Secondary | ICD-10-CM | POA: Diagnosis not present

## 2019-12-26 DIAGNOSIS — I119 Hypertensive heart disease without heart failure: Secondary | ICD-10-CM | POA: Diagnosis not present

## 2019-12-27 DIAGNOSIS — I119 Hypertensive heart disease without heart failure: Secondary | ICD-10-CM | POA: Diagnosis not present

## 2019-12-28 DIAGNOSIS — I119 Hypertensive heart disease without heart failure: Secondary | ICD-10-CM | POA: Diagnosis not present

## 2019-12-29 DIAGNOSIS — I119 Hypertensive heart disease without heart failure: Secondary | ICD-10-CM | POA: Diagnosis not present

## 2019-12-30 DIAGNOSIS — I119 Hypertensive heart disease without heart failure: Secondary | ICD-10-CM | POA: Diagnosis not present

## 2019-12-31 DIAGNOSIS — I119 Hypertensive heart disease without heart failure: Secondary | ICD-10-CM | POA: Diagnosis not present

## 2020-01-01 DIAGNOSIS — I119 Hypertensive heart disease without heart failure: Secondary | ICD-10-CM | POA: Diagnosis not present

## 2020-01-02 DIAGNOSIS — I119 Hypertensive heart disease without heart failure: Secondary | ICD-10-CM | POA: Diagnosis not present

## 2020-01-03 DIAGNOSIS — I119 Hypertensive heart disease without heart failure: Secondary | ICD-10-CM | POA: Diagnosis not present

## 2020-01-04 DIAGNOSIS — I119 Hypertensive heart disease without heart failure: Secondary | ICD-10-CM | POA: Diagnosis not present

## 2020-01-05 DIAGNOSIS — I119 Hypertensive heart disease without heart failure: Secondary | ICD-10-CM | POA: Diagnosis not present

## 2020-01-06 DIAGNOSIS — I119 Hypertensive heart disease without heart failure: Secondary | ICD-10-CM | POA: Diagnosis not present

## 2020-01-07 DIAGNOSIS — I119 Hypertensive heart disease without heart failure: Secondary | ICD-10-CM | POA: Diagnosis not present

## 2020-01-08 DIAGNOSIS — I119 Hypertensive heart disease without heart failure: Secondary | ICD-10-CM | POA: Diagnosis not present

## 2020-01-09 DIAGNOSIS — I119 Hypertensive heart disease without heart failure: Secondary | ICD-10-CM | POA: Diagnosis not present

## 2020-01-10 DIAGNOSIS — I119 Hypertensive heart disease without heart failure: Secondary | ICD-10-CM | POA: Diagnosis not present

## 2020-01-11 DIAGNOSIS — I119 Hypertensive heart disease without heart failure: Secondary | ICD-10-CM | POA: Diagnosis not present

## 2020-01-12 DIAGNOSIS — I119 Hypertensive heart disease without heart failure: Secondary | ICD-10-CM | POA: Diagnosis not present

## 2020-01-13 DIAGNOSIS — I119 Hypertensive heart disease without heart failure: Secondary | ICD-10-CM | POA: Diagnosis not present

## 2020-01-14 DIAGNOSIS — I119 Hypertensive heart disease without heart failure: Secondary | ICD-10-CM | POA: Diagnosis not present

## 2020-01-15 DIAGNOSIS — I119 Hypertensive heart disease without heart failure: Secondary | ICD-10-CM | POA: Diagnosis not present

## 2020-01-16 DIAGNOSIS — I119 Hypertensive heart disease without heart failure: Secondary | ICD-10-CM | POA: Diagnosis not present

## 2020-01-17 DIAGNOSIS — I119 Hypertensive heart disease without heart failure: Secondary | ICD-10-CM | POA: Diagnosis not present

## 2020-01-18 DIAGNOSIS — I119 Hypertensive heart disease without heart failure: Secondary | ICD-10-CM | POA: Diagnosis not present

## 2020-01-19 DIAGNOSIS — I119 Hypertensive heart disease without heart failure: Secondary | ICD-10-CM | POA: Diagnosis not present

## 2020-01-20 DIAGNOSIS — I119 Hypertensive heart disease without heart failure: Secondary | ICD-10-CM | POA: Diagnosis not present

## 2020-01-21 DIAGNOSIS — I119 Hypertensive heart disease without heart failure: Secondary | ICD-10-CM | POA: Diagnosis not present

## 2020-01-22 DIAGNOSIS — I119 Hypertensive heart disease without heart failure: Secondary | ICD-10-CM | POA: Diagnosis not present

## 2020-01-23 DIAGNOSIS — I119 Hypertensive heart disease without heart failure: Secondary | ICD-10-CM | POA: Diagnosis not present

## 2020-01-24 DIAGNOSIS — I119 Hypertensive heart disease without heart failure: Secondary | ICD-10-CM | POA: Diagnosis not present

## 2020-01-25 DIAGNOSIS — I119 Hypertensive heart disease without heart failure: Secondary | ICD-10-CM | POA: Diagnosis not present

## 2020-01-26 DIAGNOSIS — I119 Hypertensive heart disease without heart failure: Secondary | ICD-10-CM | POA: Diagnosis not present

## 2020-01-27 DIAGNOSIS — I119 Hypertensive heart disease without heart failure: Secondary | ICD-10-CM | POA: Diagnosis not present

## 2020-01-28 DIAGNOSIS — I119 Hypertensive heart disease without heart failure: Secondary | ICD-10-CM | POA: Diagnosis not present

## 2020-02-09 DIAGNOSIS — I119 Hypertensive heart disease without heart failure: Secondary | ICD-10-CM | POA: Diagnosis not present

## 2020-02-10 DIAGNOSIS — I119 Hypertensive heart disease without heart failure: Secondary | ICD-10-CM | POA: Diagnosis not present

## 2020-02-11 DIAGNOSIS — I119 Hypertensive heart disease without heart failure: Secondary | ICD-10-CM | POA: Diagnosis not present

## 2020-02-12 DIAGNOSIS — I119 Hypertensive heart disease without heart failure: Secondary | ICD-10-CM | POA: Diagnosis not present

## 2020-02-13 DIAGNOSIS — I119 Hypertensive heart disease without heart failure: Secondary | ICD-10-CM | POA: Diagnosis not present

## 2020-02-14 DIAGNOSIS — I119 Hypertensive heart disease without heart failure: Secondary | ICD-10-CM | POA: Diagnosis not present

## 2020-02-15 DIAGNOSIS — I119 Hypertensive heart disease without heart failure: Secondary | ICD-10-CM | POA: Diagnosis not present

## 2020-02-16 DIAGNOSIS — I119 Hypertensive heart disease without heart failure: Secondary | ICD-10-CM | POA: Diagnosis not present

## 2020-02-17 DIAGNOSIS — I119 Hypertensive heart disease without heart failure: Secondary | ICD-10-CM | POA: Diagnosis not present

## 2020-02-18 DIAGNOSIS — I119 Hypertensive heart disease without heart failure: Secondary | ICD-10-CM | POA: Diagnosis not present

## 2020-02-19 DIAGNOSIS — I119 Hypertensive heart disease without heart failure: Secondary | ICD-10-CM | POA: Diagnosis not present

## 2020-02-20 DIAGNOSIS — I119 Hypertensive heart disease without heart failure: Secondary | ICD-10-CM | POA: Diagnosis not present

## 2020-02-21 DIAGNOSIS — I119 Hypertensive heart disease without heart failure: Secondary | ICD-10-CM | POA: Diagnosis not present

## 2020-02-22 DIAGNOSIS — I119 Hypertensive heart disease without heart failure: Secondary | ICD-10-CM | POA: Diagnosis not present

## 2020-02-23 DIAGNOSIS — I119 Hypertensive heart disease without heart failure: Secondary | ICD-10-CM | POA: Diagnosis not present

## 2020-02-24 DIAGNOSIS — I119 Hypertensive heart disease without heart failure: Secondary | ICD-10-CM | POA: Diagnosis not present

## 2020-02-25 DIAGNOSIS — I119 Hypertensive heart disease without heart failure: Secondary | ICD-10-CM | POA: Diagnosis not present

## 2020-02-26 DIAGNOSIS — I119 Hypertensive heart disease without heart failure: Secondary | ICD-10-CM | POA: Diagnosis not present

## 2020-02-27 DIAGNOSIS — I119 Hypertensive heart disease without heart failure: Secondary | ICD-10-CM | POA: Diagnosis not present

## 2020-02-28 DIAGNOSIS — I119 Hypertensive heart disease without heart failure: Secondary | ICD-10-CM | POA: Diagnosis not present

## 2020-02-29 DIAGNOSIS — I119 Hypertensive heart disease without heart failure: Secondary | ICD-10-CM | POA: Diagnosis not present

## 2020-03-01 DIAGNOSIS — I119 Hypertensive heart disease without heart failure: Secondary | ICD-10-CM | POA: Diagnosis not present

## 2020-03-02 DIAGNOSIS — I119 Hypertensive heart disease without heart failure: Secondary | ICD-10-CM | POA: Diagnosis not present

## 2020-03-03 DIAGNOSIS — I119 Hypertensive heart disease without heart failure: Secondary | ICD-10-CM | POA: Diagnosis not present

## 2020-03-04 DIAGNOSIS — I119 Hypertensive heart disease without heart failure: Secondary | ICD-10-CM | POA: Diagnosis not present

## 2020-03-05 DIAGNOSIS — I119 Hypertensive heart disease without heart failure: Secondary | ICD-10-CM | POA: Diagnosis not present

## 2020-03-06 DIAGNOSIS — I119 Hypertensive heart disease without heart failure: Secondary | ICD-10-CM | POA: Diagnosis not present

## 2020-03-07 DIAGNOSIS — I119 Hypertensive heart disease without heart failure: Secondary | ICD-10-CM | POA: Diagnosis not present

## 2020-03-08 DIAGNOSIS — I119 Hypertensive heart disease without heart failure: Secondary | ICD-10-CM | POA: Diagnosis not present

## 2020-03-09 DIAGNOSIS — I119 Hypertensive heart disease without heart failure: Secondary | ICD-10-CM | POA: Diagnosis not present

## 2020-03-10 DIAGNOSIS — I119 Hypertensive heart disease without heart failure: Secondary | ICD-10-CM | POA: Diagnosis not present

## 2020-03-11 DIAGNOSIS — I119 Hypertensive heart disease without heart failure: Secondary | ICD-10-CM | POA: Diagnosis not present

## 2020-03-12 DIAGNOSIS — I119 Hypertensive heart disease without heart failure: Secondary | ICD-10-CM | POA: Diagnosis not present

## 2020-03-13 DIAGNOSIS — I119 Hypertensive heart disease without heart failure: Secondary | ICD-10-CM | POA: Diagnosis not present

## 2020-03-14 DIAGNOSIS — I119 Hypertensive heart disease without heart failure: Secondary | ICD-10-CM | POA: Diagnosis not present

## 2020-03-15 DIAGNOSIS — I119 Hypertensive heart disease without heart failure: Secondary | ICD-10-CM | POA: Diagnosis not present

## 2020-03-16 DIAGNOSIS — I119 Hypertensive heart disease without heart failure: Secondary | ICD-10-CM | POA: Diagnosis not present

## 2020-03-17 DIAGNOSIS — I119 Hypertensive heart disease without heart failure: Secondary | ICD-10-CM | POA: Diagnosis not present

## 2020-03-18 DIAGNOSIS — I119 Hypertensive heart disease without heart failure: Secondary | ICD-10-CM | POA: Diagnosis not present

## 2020-03-19 DIAGNOSIS — I119 Hypertensive heart disease without heart failure: Secondary | ICD-10-CM | POA: Diagnosis not present

## 2020-03-20 DIAGNOSIS — I119 Hypertensive heart disease without heart failure: Secondary | ICD-10-CM | POA: Diagnosis not present

## 2020-03-21 DIAGNOSIS — I119 Hypertensive heart disease without heart failure: Secondary | ICD-10-CM | POA: Diagnosis not present

## 2020-03-22 DIAGNOSIS — I119 Hypertensive heart disease without heart failure: Secondary | ICD-10-CM | POA: Diagnosis not present

## 2020-03-23 DIAGNOSIS — I119 Hypertensive heart disease without heart failure: Secondary | ICD-10-CM | POA: Diagnosis not present

## 2020-03-24 DIAGNOSIS — I119 Hypertensive heart disease without heart failure: Secondary | ICD-10-CM | POA: Diagnosis not present

## 2020-03-25 DIAGNOSIS — I119 Hypertensive heart disease without heart failure: Secondary | ICD-10-CM | POA: Diagnosis not present

## 2020-03-26 DIAGNOSIS — I119 Hypertensive heart disease without heart failure: Secondary | ICD-10-CM | POA: Diagnosis not present

## 2020-03-27 DIAGNOSIS — I119 Hypertensive heart disease without heart failure: Secondary | ICD-10-CM | POA: Diagnosis not present

## 2020-03-28 DIAGNOSIS — I119 Hypertensive heart disease without heart failure: Secondary | ICD-10-CM | POA: Diagnosis not present

## 2020-03-29 DIAGNOSIS — I119 Hypertensive heart disease without heart failure: Secondary | ICD-10-CM | POA: Diagnosis not present

## 2020-03-30 DIAGNOSIS — I119 Hypertensive heart disease without heart failure: Secondary | ICD-10-CM | POA: Diagnosis not present

## 2020-03-31 DIAGNOSIS — I119 Hypertensive heart disease without heart failure: Secondary | ICD-10-CM | POA: Diagnosis not present

## 2020-04-01 DIAGNOSIS — I119 Hypertensive heart disease without heart failure: Secondary | ICD-10-CM | POA: Diagnosis not present

## 2020-04-02 DIAGNOSIS — I119 Hypertensive heart disease without heart failure: Secondary | ICD-10-CM | POA: Diagnosis not present

## 2020-04-03 DIAGNOSIS — I119 Hypertensive heart disease without heart failure: Secondary | ICD-10-CM | POA: Diagnosis not present

## 2020-04-04 DIAGNOSIS — I119 Hypertensive heart disease without heart failure: Secondary | ICD-10-CM | POA: Diagnosis not present

## 2020-04-05 DIAGNOSIS — I119 Hypertensive heart disease without heart failure: Secondary | ICD-10-CM | POA: Diagnosis not present

## 2020-04-06 DIAGNOSIS — I119 Hypertensive heart disease without heart failure: Secondary | ICD-10-CM | POA: Diagnosis not present

## 2020-04-07 DIAGNOSIS — I119 Hypertensive heart disease without heart failure: Secondary | ICD-10-CM | POA: Diagnosis not present

## 2020-04-08 DIAGNOSIS — I119 Hypertensive heart disease without heart failure: Secondary | ICD-10-CM | POA: Diagnosis not present

## 2020-04-09 DIAGNOSIS — I119 Hypertensive heart disease without heart failure: Secondary | ICD-10-CM | POA: Diagnosis not present

## 2020-04-10 DIAGNOSIS — I119 Hypertensive heart disease without heart failure: Secondary | ICD-10-CM | POA: Diagnosis not present

## 2020-04-11 DIAGNOSIS — I119 Hypertensive heart disease without heart failure: Secondary | ICD-10-CM | POA: Diagnosis not present

## 2020-04-12 DIAGNOSIS — I119 Hypertensive heart disease without heart failure: Secondary | ICD-10-CM | POA: Diagnosis not present

## 2020-04-13 DIAGNOSIS — I119 Hypertensive heart disease without heart failure: Secondary | ICD-10-CM | POA: Diagnosis not present

## 2020-04-14 DIAGNOSIS — I119 Hypertensive heart disease without heart failure: Secondary | ICD-10-CM | POA: Diagnosis not present

## 2020-04-15 DIAGNOSIS — I119 Hypertensive heart disease without heart failure: Secondary | ICD-10-CM | POA: Diagnosis not present

## 2020-04-16 DIAGNOSIS — I119 Hypertensive heart disease without heart failure: Secondary | ICD-10-CM | POA: Diagnosis not present

## 2020-04-17 DIAGNOSIS — I119 Hypertensive heart disease without heart failure: Secondary | ICD-10-CM | POA: Diagnosis not present

## 2020-04-18 DIAGNOSIS — I119 Hypertensive heart disease without heart failure: Secondary | ICD-10-CM | POA: Diagnosis not present

## 2020-04-26 DIAGNOSIS — I119 Hypertensive heart disease without heart failure: Secondary | ICD-10-CM | POA: Diagnosis not present

## 2020-04-27 DIAGNOSIS — I119 Hypertensive heart disease without heart failure: Secondary | ICD-10-CM | POA: Diagnosis not present

## 2020-04-28 DIAGNOSIS — I119 Hypertensive heart disease without heart failure: Secondary | ICD-10-CM | POA: Diagnosis not present

## 2020-04-29 DIAGNOSIS — I119 Hypertensive heart disease without heart failure: Secondary | ICD-10-CM | POA: Diagnosis not present

## 2020-04-30 DIAGNOSIS — I119 Hypertensive heart disease without heart failure: Secondary | ICD-10-CM | POA: Diagnosis not present

## 2020-05-01 DIAGNOSIS — I119 Hypertensive heart disease without heart failure: Secondary | ICD-10-CM | POA: Diagnosis not present

## 2020-05-02 DIAGNOSIS — I119 Hypertensive heart disease without heart failure: Secondary | ICD-10-CM | POA: Diagnosis not present

## 2020-05-03 DIAGNOSIS — I119 Hypertensive heart disease without heart failure: Secondary | ICD-10-CM | POA: Diagnosis not present

## 2020-05-04 DIAGNOSIS — I119 Hypertensive heart disease without heart failure: Secondary | ICD-10-CM | POA: Diagnosis not present

## 2020-05-05 DIAGNOSIS — I119 Hypertensive heart disease without heart failure: Secondary | ICD-10-CM | POA: Diagnosis not present

## 2020-05-06 DIAGNOSIS — I119 Hypertensive heart disease without heart failure: Secondary | ICD-10-CM | POA: Diagnosis not present

## 2020-05-07 DIAGNOSIS — I119 Hypertensive heart disease without heart failure: Secondary | ICD-10-CM | POA: Diagnosis not present

## 2020-05-07 DIAGNOSIS — Z1388 Encounter for screening for disorder due to exposure to contaminants: Secondary | ICD-10-CM | POA: Diagnosis not present

## 2020-05-07 DIAGNOSIS — Z0389 Encounter for observation for other suspected diseases and conditions ruled out: Secondary | ICD-10-CM | POA: Diagnosis not present

## 2020-05-07 DIAGNOSIS — Z3009 Encounter for other general counseling and advice on contraception: Secondary | ICD-10-CM | POA: Diagnosis not present

## 2020-05-08 DIAGNOSIS — I119 Hypertensive heart disease without heart failure: Secondary | ICD-10-CM | POA: Diagnosis not present

## 2020-05-09 DIAGNOSIS — I119 Hypertensive heart disease without heart failure: Secondary | ICD-10-CM | POA: Diagnosis not present

## 2020-05-13 DIAGNOSIS — I119 Hypertensive heart disease without heart failure: Secondary | ICD-10-CM | POA: Diagnosis not present

## 2020-05-14 DIAGNOSIS — I119 Hypertensive heart disease without heart failure: Secondary | ICD-10-CM | POA: Diagnosis not present

## 2020-05-15 DIAGNOSIS — I119 Hypertensive heart disease without heart failure: Secondary | ICD-10-CM | POA: Diagnosis not present

## 2020-05-16 DIAGNOSIS — I119 Hypertensive heart disease without heart failure: Secondary | ICD-10-CM | POA: Diagnosis not present

## 2020-05-17 DIAGNOSIS — I119 Hypertensive heart disease without heart failure: Secondary | ICD-10-CM | POA: Diagnosis not present

## 2020-05-18 DIAGNOSIS — I119 Hypertensive heart disease without heart failure: Secondary | ICD-10-CM | POA: Diagnosis not present

## 2020-05-19 DIAGNOSIS — I119 Hypertensive heart disease without heart failure: Secondary | ICD-10-CM | POA: Diagnosis not present

## 2020-05-20 DIAGNOSIS — I119 Hypertensive heart disease without heart failure: Secondary | ICD-10-CM | POA: Diagnosis not present

## 2020-05-21 DIAGNOSIS — I119 Hypertensive heart disease without heart failure: Secondary | ICD-10-CM | POA: Diagnosis not present

## 2020-05-22 DIAGNOSIS — I119 Hypertensive heart disease without heart failure: Secondary | ICD-10-CM | POA: Diagnosis not present

## 2020-05-23 DIAGNOSIS — I119 Hypertensive heart disease without heart failure: Secondary | ICD-10-CM | POA: Diagnosis not present

## 2020-05-24 DIAGNOSIS — I119 Hypertensive heart disease without heart failure: Secondary | ICD-10-CM | POA: Diagnosis not present

## 2020-05-25 DIAGNOSIS — I119 Hypertensive heart disease without heart failure: Secondary | ICD-10-CM | POA: Diagnosis not present

## 2020-05-26 DIAGNOSIS — I119 Hypertensive heart disease without heart failure: Secondary | ICD-10-CM | POA: Diagnosis not present

## 2020-05-27 DIAGNOSIS — I119 Hypertensive heart disease without heart failure: Secondary | ICD-10-CM | POA: Diagnosis not present

## 2020-05-28 DIAGNOSIS — I119 Hypertensive heart disease without heart failure: Secondary | ICD-10-CM | POA: Diagnosis not present

## 2020-05-29 DIAGNOSIS — I119 Hypertensive heart disease without heart failure: Secondary | ICD-10-CM | POA: Diagnosis not present

## 2020-05-30 DIAGNOSIS — I119 Hypertensive heart disease without heart failure: Secondary | ICD-10-CM | POA: Diagnosis not present

## 2020-05-31 DIAGNOSIS — I119 Hypertensive heart disease without heart failure: Secondary | ICD-10-CM | POA: Diagnosis not present

## 2020-06-01 DIAGNOSIS — I119 Hypertensive heart disease without heart failure: Secondary | ICD-10-CM | POA: Diagnosis not present

## 2020-06-02 DIAGNOSIS — I119 Hypertensive heart disease without heart failure: Secondary | ICD-10-CM | POA: Diagnosis not present

## 2020-06-03 DIAGNOSIS — I119 Hypertensive heart disease without heart failure: Secondary | ICD-10-CM | POA: Diagnosis not present

## 2020-06-04 DIAGNOSIS — I119 Hypertensive heart disease without heart failure: Secondary | ICD-10-CM | POA: Diagnosis not present

## 2020-06-05 DIAGNOSIS — I119 Hypertensive heart disease without heart failure: Secondary | ICD-10-CM | POA: Diagnosis not present

## 2020-06-06 DIAGNOSIS — I119 Hypertensive heart disease without heart failure: Secondary | ICD-10-CM | POA: Diagnosis not present

## 2020-06-07 DIAGNOSIS — I119 Hypertensive heart disease without heart failure: Secondary | ICD-10-CM | POA: Diagnosis not present

## 2020-06-08 DIAGNOSIS — I119 Hypertensive heart disease without heart failure: Secondary | ICD-10-CM | POA: Diagnosis not present

## 2020-06-09 DIAGNOSIS — I119 Hypertensive heart disease without heart failure: Secondary | ICD-10-CM | POA: Diagnosis not present

## 2020-06-10 DIAGNOSIS — I119 Hypertensive heart disease without heart failure: Secondary | ICD-10-CM | POA: Diagnosis not present

## 2020-06-11 DIAGNOSIS — I119 Hypertensive heart disease without heart failure: Secondary | ICD-10-CM | POA: Diagnosis not present

## 2020-06-12 DIAGNOSIS — I119 Hypertensive heart disease without heart failure: Secondary | ICD-10-CM | POA: Diagnosis not present

## 2020-06-13 DIAGNOSIS — I119 Hypertensive heart disease without heart failure: Secondary | ICD-10-CM | POA: Diagnosis not present

## 2020-06-15 DIAGNOSIS — I119 Hypertensive heart disease without heart failure: Secondary | ICD-10-CM | POA: Diagnosis not present

## 2020-06-16 DIAGNOSIS — I119 Hypertensive heart disease without heart failure: Secondary | ICD-10-CM | POA: Diagnosis not present

## 2020-06-18 DIAGNOSIS — I119 Hypertensive heart disease without heart failure: Secondary | ICD-10-CM | POA: Diagnosis not present

## 2020-06-20 DIAGNOSIS — I119 Hypertensive heart disease without heart failure: Secondary | ICD-10-CM | POA: Diagnosis not present

## 2020-06-24 DIAGNOSIS — I119 Hypertensive heart disease without heart failure: Secondary | ICD-10-CM | POA: Diagnosis not present

## 2020-06-25 DIAGNOSIS — I119 Hypertensive heart disease without heart failure: Secondary | ICD-10-CM | POA: Diagnosis not present

## 2020-06-26 DIAGNOSIS — I119 Hypertensive heart disease without heart failure: Secondary | ICD-10-CM | POA: Diagnosis not present

## 2020-06-27 DIAGNOSIS — I119 Hypertensive heart disease without heart failure: Secondary | ICD-10-CM | POA: Diagnosis not present

## 2020-06-29 DIAGNOSIS — I119 Hypertensive heart disease without heart failure: Secondary | ICD-10-CM | POA: Diagnosis not present

## 2020-06-30 DIAGNOSIS — I119 Hypertensive heart disease without heart failure: Secondary | ICD-10-CM | POA: Diagnosis not present

## 2020-07-01 DIAGNOSIS — I119 Hypertensive heart disease without heart failure: Secondary | ICD-10-CM | POA: Diagnosis not present

## 2020-07-03 DIAGNOSIS — I119 Hypertensive heart disease without heart failure: Secondary | ICD-10-CM | POA: Diagnosis not present

## 2020-07-04 DIAGNOSIS — I119 Hypertensive heart disease without heart failure: Secondary | ICD-10-CM | POA: Diagnosis not present

## 2020-07-05 DIAGNOSIS — I119 Hypertensive heart disease without heart failure: Secondary | ICD-10-CM | POA: Diagnosis not present

## 2020-07-06 DIAGNOSIS — I119 Hypertensive heart disease without heart failure: Secondary | ICD-10-CM | POA: Diagnosis not present

## 2020-07-07 DIAGNOSIS — I119 Hypertensive heart disease without heart failure: Secondary | ICD-10-CM | POA: Diagnosis not present

## 2020-07-08 DIAGNOSIS — I119 Hypertensive heart disease without heart failure: Secondary | ICD-10-CM | POA: Diagnosis not present

## 2020-07-09 DIAGNOSIS — I119 Hypertensive heart disease without heart failure: Secondary | ICD-10-CM | POA: Diagnosis not present

## 2020-07-10 DIAGNOSIS — I119 Hypertensive heart disease without heart failure: Secondary | ICD-10-CM | POA: Diagnosis not present

## 2020-07-11 DIAGNOSIS — I119 Hypertensive heart disease without heart failure: Secondary | ICD-10-CM | POA: Diagnosis not present

## 2020-07-12 DIAGNOSIS — I119 Hypertensive heart disease without heart failure: Secondary | ICD-10-CM | POA: Diagnosis not present

## 2020-07-13 DIAGNOSIS — I119 Hypertensive heart disease without heart failure: Secondary | ICD-10-CM | POA: Diagnosis not present

## 2020-07-14 DIAGNOSIS — I119 Hypertensive heart disease without heart failure: Secondary | ICD-10-CM | POA: Diagnosis not present

## 2020-07-15 DIAGNOSIS — I119 Hypertensive heart disease without heart failure: Secondary | ICD-10-CM | POA: Diagnosis not present

## 2020-07-16 DIAGNOSIS — I119 Hypertensive heart disease without heart failure: Secondary | ICD-10-CM | POA: Diagnosis not present

## 2020-07-18 DIAGNOSIS — I119 Hypertensive heart disease without heart failure: Secondary | ICD-10-CM | POA: Diagnosis not present

## 2020-07-19 DIAGNOSIS — I119 Hypertensive heart disease without heart failure: Secondary | ICD-10-CM | POA: Diagnosis not present

## 2020-07-20 DIAGNOSIS — I119 Hypertensive heart disease without heart failure: Secondary | ICD-10-CM | POA: Diagnosis not present

## 2020-07-21 DIAGNOSIS — I119 Hypertensive heart disease without heart failure: Secondary | ICD-10-CM | POA: Diagnosis not present

## 2020-07-22 DIAGNOSIS — I119 Hypertensive heart disease without heart failure: Secondary | ICD-10-CM | POA: Diagnosis not present

## 2020-07-24 DIAGNOSIS — I119 Hypertensive heart disease without heart failure: Secondary | ICD-10-CM | POA: Diagnosis not present

## 2020-07-26 DIAGNOSIS — I119 Hypertensive heart disease without heart failure: Secondary | ICD-10-CM | POA: Diagnosis not present

## 2020-07-27 DIAGNOSIS — I119 Hypertensive heart disease without heart failure: Secondary | ICD-10-CM | POA: Diagnosis not present

## 2020-07-29 DIAGNOSIS — I119 Hypertensive heart disease without heart failure: Secondary | ICD-10-CM | POA: Diagnosis not present

## 2020-07-30 DIAGNOSIS — I119 Hypertensive heart disease without heart failure: Secondary | ICD-10-CM | POA: Diagnosis not present

## 2020-08-01 DIAGNOSIS — I119 Hypertensive heart disease without heart failure: Secondary | ICD-10-CM | POA: Diagnosis not present

## 2020-08-02 DIAGNOSIS — I119 Hypertensive heart disease without heart failure: Secondary | ICD-10-CM | POA: Diagnosis not present

## 2020-08-04 DIAGNOSIS — I119 Hypertensive heart disease without heart failure: Secondary | ICD-10-CM | POA: Diagnosis not present

## 2020-08-06 DIAGNOSIS — I119 Hypertensive heart disease without heart failure: Secondary | ICD-10-CM | POA: Diagnosis not present

## 2020-08-08 ENCOUNTER — Emergency Department: Payer: Medicaid Other

## 2020-08-08 ENCOUNTER — Emergency Department
Admission: EM | Admit: 2020-08-08 | Discharge: 2020-08-08 | Disposition: A | Discharge status: Home / Self Care | Payer: Medicaid Other | Attending: Emergency Medicine | Admitting: Emergency Medicine

## 2020-08-08 ENCOUNTER — Other Ambulatory Visit: Payer: Self-pay

## 2020-08-08 ENCOUNTER — Encounter: Payer: Self-pay | Admitting: Emergency Medicine

## 2020-08-08 DIAGNOSIS — W228XXA Striking against or struck by other objects, initial encounter: Secondary | ICD-10-CM | POA: Diagnosis not present

## 2020-08-08 DIAGNOSIS — M24011 Loose body in right shoulder: Secondary | ICD-10-CM | POA: Diagnosis not present

## 2020-08-08 DIAGNOSIS — I119 Hypertensive heart disease without heart failure: Secondary | ICD-10-CM | POA: Diagnosis not present

## 2020-08-08 DIAGNOSIS — R0781 Pleurodynia: Secondary | ICD-10-CM | POA: Diagnosis not present

## 2020-08-08 DIAGNOSIS — M79631 Pain in right forearm: Secondary | ICD-10-CM | POA: Diagnosis not present

## 2020-08-08 DIAGNOSIS — Y9289 Other specified places as the place of occurrence of the external cause: Secondary | ICD-10-CM | POA: Diagnosis not present

## 2020-08-08 DIAGNOSIS — M79632 Pain in left forearm: Secondary | ICD-10-CM | POA: Diagnosis not present

## 2020-08-08 DIAGNOSIS — F17210 Nicotine dependence, cigarettes, uncomplicated: Secondary | ICD-10-CM | POA: Diagnosis not present

## 2020-08-08 DIAGNOSIS — E119 Type 2 diabetes mellitus without complications: Secondary | ICD-10-CM | POA: Insufficient documentation

## 2020-08-08 DIAGNOSIS — Y9389 Activity, other specified: Secondary | ICD-10-CM | POA: Insufficient documentation

## 2020-08-08 DIAGNOSIS — M7918 Myalgia, other site: Secondary | ICD-10-CM | POA: Insufficient documentation

## 2020-08-08 DIAGNOSIS — J45909 Unspecified asthma, uncomplicated: Secondary | ICD-10-CM | POA: Insufficient documentation

## 2020-08-08 DIAGNOSIS — S46001D Unspecified injury of muscle(s) and tendon(s) of the rotator cuff of right shoulder, subsequent encounter: Secondary | ICD-10-CM | POA: Insufficient documentation

## 2020-08-08 DIAGNOSIS — M791 Myalgia, unspecified site: Secondary | ICD-10-CM | POA: Diagnosis not present

## 2020-08-08 MED ORDER — IBUPROFEN 600 MG PO TABS
600.0000 mg | ORAL_TABLET | Freq: Once | ORAL | Status: AC
  Administered 2020-08-08: 17:00:00 600 mg via ORAL
  Filled 2020-08-08: qty 1

## 2020-08-08 MED ORDER — IBUPROFEN 600 MG PO TABS: 600.0000 mg | ORAL_TABLET | Freq: Three times a day (TID) | ORAL | 0 refills | Status: DC | PRN

## 2020-08-08 MED ORDER — TRAMADOL HCL 50 MG PO TABS
50.0000 mg | ORAL_TABLET | Freq: Once | ORAL | Status: AC
  Administered 2020-08-08: 17:00:00 50 mg via ORAL
  Filled 2020-08-08: qty 1

## 2020-08-08 MED ORDER — TRAMADOL HCL 50 MG PO TABS: 50.0000 mg | ORAL_TABLET | Freq: Four times a day (QID) | ORAL | 0 refills | Status: DC | PRN
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
## 2020-08-08 NOTE — ED Notes (Signed)
Pt may eat and drink per EDP, pt given ginger ale and crackers.
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
## 2020-08-08 NOTE — Discharge Instructions (Addendum)
Follow discharge care instruction take medication as directed.  Contact orthopedic department listed in your discharge care instruction to schedule appointment for evaluation of your rotator cuff injury.

## 2020-08-09 DIAGNOSIS — I119 Hypertensive heart disease without heart failure: Secondary | ICD-10-CM | POA: Diagnosis not present

## 2020-08-11 DIAGNOSIS — I119 Hypertensive heart disease without heart failure: Secondary | ICD-10-CM | POA: Diagnosis not present

## 2020-08-13 DIAGNOSIS — I119 Hypertensive heart disease without heart failure: Secondary | ICD-10-CM | POA: Diagnosis not present

## 2020-08-13 DIAGNOSIS — R69 Illness, unspecified: Secondary | ICD-10-CM | POA: Diagnosis not present

## 2020-08-14 DIAGNOSIS — R69 Illness, unspecified: Secondary | ICD-10-CM | POA: Diagnosis not present

## 2020-08-14 DIAGNOSIS — I119 Hypertensive heart disease without heart failure: Secondary | ICD-10-CM | POA: Diagnosis not present

## 2020-08-15 DIAGNOSIS — I119 Hypertensive heart disease without heart failure: Secondary | ICD-10-CM | POA: Diagnosis not present

## 2020-08-15 DIAGNOSIS — R69 Illness, unspecified: Secondary | ICD-10-CM | POA: Diagnosis not present

## 2020-08-16 DIAGNOSIS — R69 Illness, unspecified: Secondary | ICD-10-CM | POA: Diagnosis not present

## 2020-08-16 DIAGNOSIS — I119 Hypertensive heart disease without heart failure: Secondary | ICD-10-CM | POA: Diagnosis not present

## 2020-08-18 DIAGNOSIS — R69 Illness, unspecified: Secondary | ICD-10-CM | POA: Diagnosis not present

## 2020-08-18 DIAGNOSIS — I119 Hypertensive heart disease without heart failure: Secondary | ICD-10-CM | POA: Diagnosis not present

## 2020-08-19 DIAGNOSIS — R69 Illness, unspecified: Secondary | ICD-10-CM | POA: Diagnosis not present

## 2020-08-19 DIAGNOSIS — I119 Hypertensive heart disease without heart failure: Secondary | ICD-10-CM | POA: Diagnosis not present

## 2020-08-20 DIAGNOSIS — R69 Illness, unspecified: Secondary | ICD-10-CM | POA: Diagnosis not present

## 2020-08-20 DIAGNOSIS — I119 Hypertensive heart disease without heart failure: Secondary | ICD-10-CM | POA: Diagnosis not present

## 2020-08-21 DIAGNOSIS — I119 Hypertensive heart disease without heart failure: Secondary | ICD-10-CM | POA: Diagnosis not present

## 2020-08-21 DIAGNOSIS — R69 Illness, unspecified: Secondary | ICD-10-CM | POA: Diagnosis not present

## 2020-08-22 DIAGNOSIS — R69 Illness, unspecified: Secondary | ICD-10-CM | POA: Diagnosis not present

## 2020-08-22 DIAGNOSIS — I119 Hypertensive heart disease without heart failure: Secondary | ICD-10-CM | POA: Diagnosis not present

## 2020-08-23 DIAGNOSIS — R69 Illness, unspecified: Secondary | ICD-10-CM | POA: Diagnosis not present

## 2020-08-23 DIAGNOSIS — I119 Hypertensive heart disease without heart failure: Secondary | ICD-10-CM | POA: Diagnosis not present

## 2020-08-24 DIAGNOSIS — R69 Illness, unspecified: Secondary | ICD-10-CM | POA: Diagnosis not present

## 2020-08-24 DIAGNOSIS — I119 Hypertensive heart disease without heart failure: Secondary | ICD-10-CM | POA: Diagnosis not present

## 2020-08-25 DIAGNOSIS — I119 Hypertensive heart disease without heart failure: Secondary | ICD-10-CM | POA: Diagnosis not present

## 2020-08-25 DIAGNOSIS — R69 Illness, unspecified: Secondary | ICD-10-CM | POA: Diagnosis not present

## 2020-08-27 DIAGNOSIS — I119 Hypertensive heart disease without heart failure: Secondary | ICD-10-CM | POA: Diagnosis not present

## 2020-08-27 DIAGNOSIS — R69 Illness, unspecified: Secondary | ICD-10-CM | POA: Diagnosis not present

## 2020-08-28 DIAGNOSIS — R69 Illness, unspecified: Secondary | ICD-10-CM | POA: Diagnosis not present

## 2020-08-28 DIAGNOSIS — I119 Hypertensive heart disease without heart failure: Secondary | ICD-10-CM | POA: Diagnosis not present

## 2020-08-29 DIAGNOSIS — R69 Illness, unspecified: Secondary | ICD-10-CM | POA: Diagnosis not present

## 2020-08-29 DIAGNOSIS — I119 Hypertensive heart disease without heart failure: Secondary | ICD-10-CM | POA: Diagnosis not present

## 2020-08-30 DIAGNOSIS — R69 Illness, unspecified: Secondary | ICD-10-CM | POA: Diagnosis not present

## 2020-08-30 DIAGNOSIS — I119 Hypertensive heart disease without heart failure: Secondary | ICD-10-CM | POA: Diagnosis not present

## 2020-08-31 DIAGNOSIS — I119 Hypertensive heart disease without heart failure: Secondary | ICD-10-CM | POA: Diagnosis not present

## 2020-08-31 DIAGNOSIS — R69 Illness, unspecified: Secondary | ICD-10-CM | POA: Diagnosis not present

## 2020-09-01 DIAGNOSIS — R69 Illness, unspecified: Secondary | ICD-10-CM | POA: Diagnosis not present

## 2020-09-01 DIAGNOSIS — I119 Hypertensive heart disease without heart failure: Secondary | ICD-10-CM | POA: Diagnosis not present

## 2020-09-02 DIAGNOSIS — I119 Hypertensive heart disease without heart failure: Secondary | ICD-10-CM | POA: Diagnosis not present

## 2020-09-02 DIAGNOSIS — R69 Illness, unspecified: Secondary | ICD-10-CM | POA: Diagnosis not present

## 2020-09-03 DIAGNOSIS — I119 Hypertensive heart disease without heart failure: Secondary | ICD-10-CM | POA: Diagnosis not present

## 2020-09-03 DIAGNOSIS — R69 Illness, unspecified: Secondary | ICD-10-CM | POA: Diagnosis not present

## 2020-09-05 DIAGNOSIS — R69 Illness, unspecified: Secondary | ICD-10-CM | POA: Diagnosis not present

## 2020-09-05 DIAGNOSIS — I119 Hypertensive heart disease without heart failure: Secondary | ICD-10-CM | POA: Diagnosis not present

## 2020-09-06 DIAGNOSIS — R69 Illness, unspecified: Secondary | ICD-10-CM | POA: Diagnosis not present

## 2020-09-07 DIAGNOSIS — R69 Illness, unspecified: Secondary | ICD-10-CM | POA: Diagnosis not present

## 2020-09-08 DIAGNOSIS — R69 Illness, unspecified: Secondary | ICD-10-CM | POA: Diagnosis not present

## 2020-09-09 DIAGNOSIS — R69 Illness, unspecified: Secondary | ICD-10-CM | POA: Diagnosis not present

## 2020-09-10 DIAGNOSIS — R69 Illness, unspecified: Secondary | ICD-10-CM | POA: Diagnosis not present

## 2020-09-13 DIAGNOSIS — R69 Illness, unspecified: Secondary | ICD-10-CM | POA: Diagnosis not present

## 2020-09-14 DIAGNOSIS — R69 Illness, unspecified: Secondary | ICD-10-CM | POA: Diagnosis not present

## 2020-09-15 DIAGNOSIS — R69 Illness, unspecified: Secondary | ICD-10-CM | POA: Diagnosis not present

## 2020-09-16 DIAGNOSIS — R69 Illness, unspecified: Secondary | ICD-10-CM | POA: Diagnosis not present

## 2020-09-17 DIAGNOSIS — R69 Illness, unspecified: Secondary | ICD-10-CM | POA: Diagnosis not present

## 2020-09-18 DIAGNOSIS — R69 Illness, unspecified: Secondary | ICD-10-CM | POA: Diagnosis not present

## 2020-09-19 DIAGNOSIS — R69 Illness, unspecified: Secondary | ICD-10-CM | POA: Diagnosis not present

## 2020-09-21 DIAGNOSIS — R69 Illness, unspecified: Secondary | ICD-10-CM | POA: Diagnosis not present

## 2020-09-22 DIAGNOSIS — R69 Illness, unspecified: Secondary | ICD-10-CM | POA: Diagnosis not present

## 2020-09-28 DIAGNOSIS — R69 Illness, unspecified: Secondary | ICD-10-CM | POA: Diagnosis not present

## 2020-09-29 DIAGNOSIS — R69 Illness, unspecified: Secondary | ICD-10-CM | POA: Diagnosis not present

## 2020-10-01 DIAGNOSIS — R69 Illness, unspecified: Secondary | ICD-10-CM | POA: Diagnosis not present

## 2020-10-02 DIAGNOSIS — R69 Illness, unspecified: Secondary | ICD-10-CM | POA: Diagnosis not present

## 2020-10-03 DIAGNOSIS — R69 Illness, unspecified: Secondary | ICD-10-CM | POA: Diagnosis not present

## 2020-10-04 DIAGNOSIS — R69 Illness, unspecified: Secondary | ICD-10-CM | POA: Diagnosis not present

## 2020-10-05 DIAGNOSIS — R69 Illness, unspecified: Secondary | ICD-10-CM | POA: Diagnosis not present

## 2020-10-06 DIAGNOSIS — R69 Illness, unspecified: Secondary | ICD-10-CM | POA: Diagnosis not present

## 2020-10-07 DIAGNOSIS — R69 Illness, unspecified: Secondary | ICD-10-CM | POA: Diagnosis not present

## 2020-10-08 DIAGNOSIS — R69 Illness, unspecified: Secondary | ICD-10-CM | POA: Diagnosis not present

## 2020-10-09 DIAGNOSIS — R69 Illness, unspecified: Secondary | ICD-10-CM | POA: Diagnosis not present

## 2020-10-10 DIAGNOSIS — R69 Illness, unspecified: Secondary | ICD-10-CM | POA: Diagnosis not present

## 2020-10-12 DIAGNOSIS — R69 Illness, unspecified: Secondary | ICD-10-CM | POA: Diagnosis not present

## 2020-10-13 DIAGNOSIS — R69 Illness, unspecified: Secondary | ICD-10-CM | POA: Diagnosis not present

## 2020-10-14 DIAGNOSIS — R69 Illness, unspecified: Secondary | ICD-10-CM | POA: Diagnosis not present

## 2020-10-18 DIAGNOSIS — R69 Illness, unspecified: Secondary | ICD-10-CM | POA: Diagnosis not present

## 2020-10-20 DIAGNOSIS — R69 Illness, unspecified: Secondary | ICD-10-CM | POA: Diagnosis not present

## 2020-10-21 DIAGNOSIS — R69 Illness, unspecified: Secondary | ICD-10-CM | POA: Diagnosis not present

## 2020-10-22 DIAGNOSIS — R69 Illness, unspecified: Secondary | ICD-10-CM | POA: Diagnosis not present

## 2020-10-26 DIAGNOSIS — R69 Illness, unspecified: Secondary | ICD-10-CM | POA: Diagnosis not present

## 2020-10-28 DIAGNOSIS — R69 Illness, unspecified: Secondary | ICD-10-CM | POA: Diagnosis not present

## 2020-10-29 DIAGNOSIS — R69 Illness, unspecified: Secondary | ICD-10-CM | POA: Diagnosis not present

## 2020-10-30 DIAGNOSIS — R69 Illness, unspecified: Secondary | ICD-10-CM | POA: Diagnosis not present

## 2020-11-01 DIAGNOSIS — R69 Illness, unspecified: Secondary | ICD-10-CM | POA: Diagnosis not present

## 2020-11-02 DIAGNOSIS — R69 Illness, unspecified: Secondary | ICD-10-CM | POA: Diagnosis not present

## 2020-11-03 DIAGNOSIS — R69 Illness, unspecified: Secondary | ICD-10-CM | POA: Diagnosis not present

## 2020-11-06 DIAGNOSIS — R69 Illness, unspecified: Secondary | ICD-10-CM | POA: Diagnosis not present

## 2020-11-08 DIAGNOSIS — R69 Illness, unspecified: Secondary | ICD-10-CM | POA: Diagnosis not present

## 2020-11-10 DIAGNOSIS — R69 Illness, unspecified: Secondary | ICD-10-CM | POA: Diagnosis not present

## 2020-11-11 DIAGNOSIS — R69 Illness, unspecified: Secondary | ICD-10-CM | POA: Diagnosis not present

## 2020-11-13 DIAGNOSIS — R69 Illness, unspecified: Secondary | ICD-10-CM | POA: Diagnosis not present

## 2020-11-14 DIAGNOSIS — R69 Illness, unspecified: Secondary | ICD-10-CM | POA: Diagnosis not present

## 2020-11-15 DIAGNOSIS — R69 Illness, unspecified: Secondary | ICD-10-CM | POA: Diagnosis not present

## 2020-11-18 DIAGNOSIS — R69 Illness, unspecified: Secondary | ICD-10-CM | POA: Diagnosis not present

## 2020-11-19 DIAGNOSIS — R69 Illness, unspecified: Secondary | ICD-10-CM | POA: Diagnosis not present

## 2020-11-20 DIAGNOSIS — R69 Illness, unspecified: Secondary | ICD-10-CM | POA: Diagnosis not present

## 2020-11-21 DIAGNOSIS — R69 Illness, unspecified: Secondary | ICD-10-CM | POA: Diagnosis not present

## 2020-11-22 DIAGNOSIS — R69 Illness, unspecified: Secondary | ICD-10-CM | POA: Diagnosis not present

## 2020-11-24 DIAGNOSIS — R69 Illness, unspecified: Secondary | ICD-10-CM | POA: Diagnosis not present

## 2020-11-25 DIAGNOSIS — R69 Illness, unspecified: Secondary | ICD-10-CM | POA: Diagnosis not present

## 2020-11-28 DIAGNOSIS — R69 Illness, unspecified: Secondary | ICD-10-CM | POA: Diagnosis not present

## 2020-11-29 DIAGNOSIS — R69 Illness, unspecified: Secondary | ICD-10-CM | POA: Diagnosis not present

## 2020-11-30 DIAGNOSIS — R69 Illness, unspecified: Secondary | ICD-10-CM | POA: Diagnosis not present

## 2020-12-01 DIAGNOSIS — R69 Illness, unspecified: Secondary | ICD-10-CM | POA: Diagnosis not present

## 2020-12-02 DIAGNOSIS — R69 Illness, unspecified: Secondary | ICD-10-CM | POA: Diagnosis not present

## 2020-12-03 DIAGNOSIS — R69 Illness, unspecified: Secondary | ICD-10-CM | POA: Diagnosis not present

## 2020-12-04 DIAGNOSIS — R69 Illness, unspecified: Secondary | ICD-10-CM | POA: Diagnosis not present

## 2020-12-05 DIAGNOSIS — R69 Illness, unspecified: Secondary | ICD-10-CM | POA: Diagnosis not present

## 2020-12-07 DIAGNOSIS — R69 Illness, unspecified: Secondary | ICD-10-CM | POA: Diagnosis not present

## 2020-12-08 DIAGNOSIS — R69 Illness, unspecified: Secondary | ICD-10-CM | POA: Diagnosis not present

## 2020-12-09 DIAGNOSIS — R69 Illness, unspecified: Secondary | ICD-10-CM | POA: Diagnosis not present

## 2020-12-10 DIAGNOSIS — R69 Illness, unspecified: Secondary | ICD-10-CM | POA: Diagnosis not present

## 2020-12-11 DIAGNOSIS — R69 Illness, unspecified: Secondary | ICD-10-CM | POA: Diagnosis not present

## 2020-12-15 DIAGNOSIS — R69 Illness, unspecified: Secondary | ICD-10-CM | POA: Diagnosis not present

## 2020-12-17 DIAGNOSIS — R69 Illness, unspecified: Secondary | ICD-10-CM | POA: Diagnosis not present

## 2020-12-18 DIAGNOSIS — R69 Illness, unspecified: Secondary | ICD-10-CM | POA: Diagnosis not present

## 2020-12-20 DIAGNOSIS — R69 Illness, unspecified: Secondary | ICD-10-CM | POA: Diagnosis not present

## 2020-12-23 DIAGNOSIS — R69 Illness, unspecified: Secondary | ICD-10-CM | POA: Diagnosis not present

## 2020-12-24 DIAGNOSIS — R69 Illness, unspecified: Secondary | ICD-10-CM | POA: Diagnosis not present

## 2020-12-26 DIAGNOSIS — R69 Illness, unspecified: Secondary | ICD-10-CM | POA: Diagnosis not present

## 2020-12-27 DIAGNOSIS — R69 Illness, unspecified: Secondary | ICD-10-CM | POA: Diagnosis not present

## 2020-12-28 DIAGNOSIS — R69 Illness, unspecified: Secondary | ICD-10-CM | POA: Diagnosis not present

## 2020-12-29 DIAGNOSIS — R69 Illness, unspecified: Secondary | ICD-10-CM | POA: Diagnosis not present

## 2021-01-01 DIAGNOSIS — R69 Illness, unspecified: Secondary | ICD-10-CM | POA: Diagnosis not present

## 2021-01-02 DIAGNOSIS — R69 Illness, unspecified: Secondary | ICD-10-CM | POA: Diagnosis not present

## 2021-01-04 DIAGNOSIS — R69 Illness, unspecified: Secondary | ICD-10-CM | POA: Diagnosis not present

## 2021-01-05 DIAGNOSIS — R69 Illness, unspecified: Secondary | ICD-10-CM | POA: Diagnosis not present

## 2021-01-06 DIAGNOSIS — R69 Illness, unspecified: Secondary | ICD-10-CM | POA: Diagnosis not present

## 2021-01-07 DIAGNOSIS — R69 Illness, unspecified: Secondary | ICD-10-CM | POA: Diagnosis not present

## 2021-01-10 DIAGNOSIS — R69 Illness, unspecified: Secondary | ICD-10-CM | POA: Diagnosis not present

## 2021-01-11 DIAGNOSIS — R69 Illness, unspecified: Secondary | ICD-10-CM | POA: Diagnosis not present

## 2021-01-12 DIAGNOSIS — R69 Illness, unspecified: Secondary | ICD-10-CM | POA: Diagnosis not present

## 2021-01-13 DIAGNOSIS — R69 Illness, unspecified: Secondary | ICD-10-CM | POA: Diagnosis not present

## 2021-01-14 DIAGNOSIS — R69 Illness, unspecified: Secondary | ICD-10-CM | POA: Diagnosis not present

## 2021-01-17 DIAGNOSIS — R69 Illness, unspecified: Secondary | ICD-10-CM | POA: Diagnosis not present

## 2021-01-18 DIAGNOSIS — R69 Illness, unspecified: Secondary | ICD-10-CM | POA: Diagnosis not present

## 2021-01-19 DIAGNOSIS — R69 Illness, unspecified: Secondary | ICD-10-CM | POA: Diagnosis not present

## 2021-01-20 DIAGNOSIS — R69 Illness, unspecified: Secondary | ICD-10-CM | POA: Diagnosis not present

## 2021-01-21 DIAGNOSIS — R69 Illness, unspecified: Secondary | ICD-10-CM | POA: Diagnosis not present

## 2021-01-22 DIAGNOSIS — R69 Illness, unspecified: Secondary | ICD-10-CM | POA: Diagnosis not present

## 2021-01-24 DIAGNOSIS — R69 Illness, unspecified: Secondary | ICD-10-CM | POA: Diagnosis not present

## 2021-01-25 DIAGNOSIS — R69 Illness, unspecified: Secondary | ICD-10-CM | POA: Diagnosis not present

## 2021-01-26 DIAGNOSIS — R69 Illness, unspecified: Secondary | ICD-10-CM | POA: Diagnosis not present

## 2021-01-27 DIAGNOSIS — R69 Illness, unspecified: Secondary | ICD-10-CM | POA: Diagnosis not present

## 2021-01-30 DIAGNOSIS — R69 Illness, unspecified: Secondary | ICD-10-CM | POA: Diagnosis not present

## 2021-01-31 DIAGNOSIS — R69 Illness, unspecified: Secondary | ICD-10-CM | POA: Diagnosis not present

## 2021-02-02 DIAGNOSIS — R69 Illness, unspecified: Secondary | ICD-10-CM | POA: Diagnosis not present

## 2021-02-03 DIAGNOSIS — R69 Illness, unspecified: Secondary | ICD-10-CM | POA: Diagnosis not present

## 2021-02-05 DIAGNOSIS — R69 Illness, unspecified: Secondary | ICD-10-CM | POA: Diagnosis not present

## 2021-02-06 DIAGNOSIS — R69 Illness, unspecified: Secondary | ICD-10-CM | POA: Diagnosis not present

## 2021-02-07 DIAGNOSIS — R69 Illness, unspecified: Secondary | ICD-10-CM | POA: Diagnosis not present

## 2021-02-08 DIAGNOSIS — R69 Illness, unspecified: Secondary | ICD-10-CM | POA: Diagnosis not present

## 2021-02-09 DIAGNOSIS — R69 Illness, unspecified: Secondary | ICD-10-CM | POA: Diagnosis not present

## 2021-02-10 DIAGNOSIS — R69 Illness, unspecified: Secondary | ICD-10-CM | POA: Diagnosis not present

## 2021-04-07 DIAGNOSIS — R739 Hyperglycemia, unspecified: Secondary | ICD-10-CM

## 2021-04-07 LAB — GLUCOSE, POCT (MANUAL RESULT ENTRY): POC Glucose: 167 mg/dl — AB (ref 70–99)

## 2021-04-07 NOTE — Congregational Nurse Program (Signed)
  Dept: [phone]   Congregational Nurse Program Note  Date of Encounter: 04/07/2021 Client in to discuss foot and ankle swelling. He is currently working at a [employer] and reports standing for several hours at a time. He does have compression stockings in place, advised to take off at night. VS:106/70, 60 oxygen saturations 98 % on room air. Blood sugar checked at his request nonfasting 167. He is not currently taking any medications per his report. He plans to continue to come to clinic for regualr vital sign checks.    Past Medical History: Past Medical History:  Diagnosis Date  . Asthma   . Depression   . Diabetes mellitus without complication (HCC)   . History of blood clots    "blood clots around heart 6 months ago"  . Renal disorder     Encounter Details:  CNP Questionnaire - 04/07/21 1007      Questionnaire   Do you give verbal consent to treat you today? Yes    Visit Setting Church or Organization    Location Patient Served At [facility]    Patient Status [facility] Provider No   has appointment at [facility] on 6[facility]24   Insurance Medicaid    Intervention Assess (including screenings)

## 2021-06-17 DIAGNOSIS — Z20822 Contact with and (suspected) exposure to covid-19: Secondary | ICD-10-CM | POA: Diagnosis not present

## 2021-08-07 ENCOUNTER — Encounter: Payer: Self-pay | Admitting: Nurse Practitioner

## 2021-08-07 DIAGNOSIS — R7301 Impaired fasting glucose: Secondary | ICD-10-CM | POA: Insufficient documentation

## 2021-08-07 DIAGNOSIS — J4520 Mild intermittent asthma, uncomplicated: Secondary | ICD-10-CM | POA: Insufficient documentation

## 2021-08-07 DIAGNOSIS — Z86718 Personal history of other venous thrombosis and embolism: Secondary | ICD-10-CM | POA: Insufficient documentation

## 2021-08-07 DIAGNOSIS — F320 Major depressive disorder, single episode, mild: Secondary | ICD-10-CM | POA: Insufficient documentation

## 2021-08-12 ENCOUNTER — Ambulatory Visit: Payer: Self-pay | Admitting: Nurse Practitioner

## 2021-10-04 ENCOUNTER — Encounter: Payer: Self-pay | Admitting: Nurse Practitioner

## 2021-10-04 ENCOUNTER — Other Ambulatory Visit: Payer: Self-pay

## 2021-10-04 ENCOUNTER — Ambulatory Visit (INDEPENDENT_AMBULATORY_CARE_PROVIDER_SITE_OTHER): Payer: Medicaid Other | Admitting: Nurse Practitioner

## 2021-10-04 VITALS — BP 102/68 | HR 62 | Temp 99.0°F | Ht 67.0 in | Wt 166.8 lb

## 2021-10-04 DIAGNOSIS — Z1211 Encounter for screening for malignant neoplasm of colon: Secondary | ICD-10-CM

## 2021-10-04 DIAGNOSIS — Z1159 Encounter for screening for other viral diseases: Secondary | ICD-10-CM | POA: Diagnosis not present

## 2021-10-04 DIAGNOSIS — Z114 Encounter for screening for human immunodeficiency virus [HIV]: Secondary | ICD-10-CM | POA: Diagnosis not present

## 2021-10-04 DIAGNOSIS — R351 Nocturia: Secondary | ICD-10-CM

## 2021-10-04 DIAGNOSIS — Z86718 Personal history of other venous thrombosis and embolism: Secondary | ICD-10-CM | POA: Diagnosis not present

## 2021-10-04 DIAGNOSIS — R8281 Pyuria: Secondary | ICD-10-CM | POA: Diagnosis not present

## 2021-10-04 DIAGNOSIS — M25511 Pain in right shoulder: Secondary | ICD-10-CM | POA: Diagnosis not present

## 2021-10-04 DIAGNOSIS — Z136 Encounter for screening for cardiovascular disorders: Secondary | ICD-10-CM | POA: Diagnosis not present

## 2021-10-04 DIAGNOSIS — R051 Acute cough: Secondary | ICD-10-CM | POA: Diagnosis not present

## 2021-10-04 DIAGNOSIS — E559 Vitamin D deficiency, unspecified: Secondary | ICD-10-CM

## 2021-10-04 DIAGNOSIS — F320 Major depressive disorder, single episode, mild: Secondary | ICD-10-CM

## 2021-10-04 DIAGNOSIS — G3184 Mild cognitive impairment, so stated: Secondary | ICD-10-CM | POA: Diagnosis not present

## 2021-10-04 DIAGNOSIS — J4520 Mild intermittent asthma, uncomplicated: Secondary | ICD-10-CM

## 2021-10-04 DIAGNOSIS — N401 Enlarged prostate with lower urinary tract symptoms: Secondary | ICD-10-CM | POA: Insufficient documentation

## 2021-10-04 DIAGNOSIS — G8929 Other chronic pain: Secondary | ICD-10-CM

## 2021-10-04 DIAGNOSIS — Z7689 Persons encountering health services in other specified circumstances: Secondary | ICD-10-CM

## 2021-10-04 DIAGNOSIS — R7301 Impaired fasting glucose: Secondary | ICD-10-CM

## 2021-10-04 LAB — VERITOR FLU A/B WAIVED
Influenza A: NEGATIVE
Influenza B: NEGATIVE

## 2021-10-04 LAB — URINALYSIS, ROUTINE W REFLEX MICROSCOPIC
Bilirubin, UA: NEGATIVE
Glucose, UA: NEGATIVE
Ketones, UA: NEGATIVE
Nitrite, UA: POSITIVE — AB
Specific Gravity, UA: 1.025 (ref 1.005–1.030)
Urobilinogen, Ur: 1 mg/dL (ref 0.2–1.0)
pH, UA: 6 (ref 5.0–7.5)

## 2021-10-04 LAB — MICROALBUMIN, URINE WAIVED
Creatinine, Urine Waived: 200 mg/dL (ref 10–300)
Microalb, Ur Waived: 80 mg/L — ABNORMAL HIGH (ref 0–19)
Microalb/Creat Ratio: <30 mg/g (ref <30)

## 2021-10-04 LAB — MICROSCOPIC EXAMINATION

## 2021-10-04 MED ORDER — VITAMIN D3 25 MCG (1000 UT) PO CAPS: 1000.0000 [IU] | ORAL_CAPSULE | Freq: Every day | ORAL | 4 refills | Status: DC

## 2021-10-04 MED ORDER — BUPROPION HCL 100 MG PO TABS: 100.0000 mg | ORAL_TABLET | Freq: Two times a day (BID) | ORAL | 4 refills | Status: AC

## 2021-10-04 MED ORDER — TAMSULOSIN HCL 0.4 MG PO CAPS: 0.4000 mg | ORAL_CAPSULE | Freq: Every day | ORAL | 4 refills | Status: AC

## 2021-10-04 MED ORDER — AMOXICILLIN-POT CLAVULANATE 875-125 MG PO TABS: 1.0000 | ORAL_TABLET | Freq: Two times a day (BID) | ORAL | 0 refills | Status: AC

## 2021-10-04 NOTE — Assessment & Plan Note (Signed)
History of reported -- will monitor closely and obtain imaging if needed.

## 2021-10-04 NOTE — Assessment & Plan Note (Signed)
Chronic, stable with no Albuterol use.  Continue current regimen and adjust as needed. Labs today: CBC, CMP today.  Recommend complete cessation of smoking.

## 2021-10-04 NOTE — Assessment & Plan Note (Signed)
Chronic, ongoing, previously on Wellbutrin.  Denies SI/HI.  PHQ9 = 10 today.  Will restart Wellbutrin and adjust regimen as needed.  This would benefit both mood and smoking cessation.  Return in 4 weeks.

## 2021-10-04 NOTE — Patient Instructions (Signed)
Prediabetes Eating Plan °Prediabetes is a condition that causes blood sugar (glucose) levels to be higher than normal. This increases the risk for developing type 2 diabetes (type 2 diabetes mellitus). Working with a health care provider or nutrition specialist (dietitian) to make diet and lifestyle changes can help prevent the onset of diabetes. These changes may help you: °Control your blood glucose levels. °Improve your cholesterol levels. °Manage your blood pressure. °What are tips for following this plan? °Reading food labels °Read food labels to check the amount of fat, salt (sodium), and sugar in prepackaged foods. Avoid foods that have: °Saturated fats. °Trans fats. °Added sugars. °Avoid foods that have more than 300 milligrams (mg) of sodium per serving. Limit your sodium intake to less than 2,300 mg each day. °Shopping °Avoid buying pre-made and processed foods. °Avoid buying drinks with added sugar. °Cooking °Cook with olive oil. Do not use butter, lard, or ghee. °Bake, broil, grill, steam, or boil foods. Avoid frying. °Meal planning ° °Work with your dietitian to create an eating plan that is right for you. This may include tracking how many calories you take in each day. Use a food diary, notebook, or mobile application to track what you eat at each meal. °Consider following a Mediterranean diet. This includes: °Eating several servings of fresh fruits and vegetables each day. °Eating fish at least twice a week. °Eating one serving each day of whole grains, beans, nuts, and seeds. °Using olive oil instead of other fats. °Limiting alcohol. °Limiting red meat. °Using nonfat or low-fat dairy products. °Consider following a plant-based diet. This includes dietary choices that focus on eating mostly vegetables and fruit, grains, beans, nuts, and seeds. °If you have high blood pressure, you may need to limit your sodium intake or follow a diet such as the DASH (Dietary Approaches to Stop Hypertension) eating  plan. The DASH diet aims to lower high blood pressure. °Lifestyle °Set weight loss goals with help from your health care team. It is recommended that most people with prediabetes lose 7% of their body weight. °Exercise for at least 30 minutes 5 or more days a week. °Attend a support group or seek support from a mental health counselor. °Take over-the-counter and prescription medicines only as told by your health care provider. °What foods are recommended? °Fruits °Berries. Bananas. Apples. Oranges. Grapes. Papaya. Mango. Pomegranate. Kiwi. Grapefruit. Cherries. °Vegetables °Lettuce. Spinach. Peas. Beets. Cauliflower. Cabbage. Broccoli. Carrots. Tomatoes. Squash. Eggplant. Herbs. Peppers. Onions. Cucumbers. Brussels sprouts. °Grains °Whole grains, such as whole-wheat or whole-grain breads, crackers, cereals, and pasta. Unsweetened oatmeal. Bulgur. Barley. Quinoa. Brown rice. Corn or whole-wheat flour tortillas or taco shells. °Meats and other proteins °Seafood. Poultry without skin. Lean cuts of pork and beef. Tofu. Eggs. Nuts. Beans. °Dairy °Low-fat or fat-free dairy products, such as yogurt, cottage cheese, and cheese. °Beverages °Water. Tea. Coffee. Sugar-free or diet soda. Seltzer water. Low-fat or nonfat milk. Milk alternatives, such as soy or almond milk. °Fats and oils °Olive oil. Canola oil. Sunflower oil. Grapeseed oil. Avocado. Walnuts. °Sweets and desserts °Sugar-free or low-fat pudding. Sugar-free or low-fat ice cream and other frozen treats. °Seasonings and condiments °Herbs. Sodium-free spices. Mustard. Relish. Low-salt, low-sugar ketchup. Low-salt, low-sugar barbecue sauce. Low-fat or fat-free mayonnaise. °The items listed above may not be a complete list of recommended foods and beverages. Contact a dietitian for more information. °What foods are not recommended? °Fruits °Fruits canned with syrup. °Vegetables °Canned vegetables. Frozen vegetables with butter or cream sauce. °Grains °Refined white  flour and flour   products, such as bread, pasta, snack foods, and cereals. °Meats and other proteins °Fatty cuts of meat. Poultry with skin. Breaded or fried meat. Processed meats. °Dairy °Full-fat yogurt, cheese, or milk. °Beverages °Sweetened drinks, such as iced tea and soda. °Fats and oils °Butter. Lard. Ghee. °Sweets and desserts °Baked goods, such as cake, cupcakes, pastries, cookies, and cheesecake. °Seasonings and condiments °Spice mixes with added salt. Ketchup. Barbecue sauce. Mayonnaise. °The items listed above may not be a complete list of foods and beverages that are not recommended. Contact a dietitian for more information. °Where to find more information °American Diabetes Association: www.diabetes.org °Summary °You may need to make diet and lifestyle changes to help prevent the onset of diabetes. These changes can help you control blood sugar, improve cholesterol levels, and manage blood pressure. °Set weight loss goals with help from your health care team. It is recommended that most people with prediabetes lose 7% of their body weight. °Consider following a Mediterranean diet. This includes eating plenty of fresh fruits and vegetables, whole grains, beans, nuts, seeds, fish, and low-fat dairy, and using olive oil instead of other fats. °This information is not intended to replace advice given to you by your health care provider. Make sure you discuss any questions you have with your health care provider. °Document Revised: 01/29/2020 Document Reviewed: 01/29/2020 °Elsevier Patient Education © 2022 [employer] ° °

## 2021-10-04 NOTE — Assessment & Plan Note (Signed)
Chronic, ongoing.  At this time will restart Flomax due to ongoing symptoms and adjust regimen as needed.  Referral to urology placed, as was to attend in past.  Check labs today: PSA and UA.

## 2021-10-04 NOTE — Assessment & Plan Note (Signed)
Reports history of neurology assessment and presence of mild cognitive impairment.  Monitor closely and return to neurology as needed.  History of multiple concussions.  Last CT head in May 2016.  Labs today B12, CBC, CMP, RPR, TSH, Vit D.

## 2021-10-04 NOTE — Progress Notes (Addendum)
New Patient Office Visit  Subjective:  Patient ID: Jesse Lam, male    DOB: 1961/02/03  Age: 60 y.o. MRN: [id number]  CC:  Chief Complaint  Patient presents with   Establish Care    Patient is here to establish care. Patient states his previous provider retired. Patient states he has been looking for a new primary care provider.    Medication Refill    Patient is requesting refills on Flomax, Wellbutrin, and Vitamin D.    Referral    Patient would to discuss a referral for an previous shoulder injury and states his previous PCP was supposed to refer him before retiring. Patient states he has limited ability in his right arm. Patient states he would like to have a Gastroenterology referral for Colonoscopy. Patient states he needs to discuss a Urology referral as well as he has pain when his urinates. Patient states he been dealing with this issue for the past 20 years.    Cough   Headache   Congestion    Patient states he has been having some coughing, headaches, and states he is coughing up green phlegm. Patient states it seems like the mucus is getting stuck in his throat. Patient states he thinks it may be the weather change and he states he wouldn't know if he has been exposed to any viruses. Patient states he has been symptomatic for about 2 weeks. Patient states he has been symptomatic currently 8 days.    Fever    HPI Jesse Lam presents for new patient visit to establish care.  Introduced to Publishing rights manager role and practice setting.  All questions answered.  Discussed provider/patient relationship and expectations. He was previously followed by Dr. Madaline Guthrie, who retired.    UPPER RESPIRATORY TRACT INFECTION Reports ongoing symptoms 12 days -- having coughing, headaches and congestion == took Nyquil and felt better for a day, then symptoms returned. Has underlying asthma and is always SOB he reports -- Clorox is a major trigger for him.    He does endorse being a smoker,  started at age 58.  1 pack lasts him a week.  He is attempting to quit. Fever: yes Cough: yes Shortness of breath: at baseline, no worsening Wheezing: yes Chest pain: no Chest tightness: yes Chest congestion: no Nasal congestion: yes Runny nose: yes Post nasal drip: yes Sneezing: yes Sore throat: yes Swollen glands: no Sinus pressure: yes Headache: yes Face pain: no Toothache: no Ear pain: none Ear pressure: none Eyes red/itching:no Eye drainage/crusting: no  Vomiting: no Rash: no Fatigue: yes Sick contacts: no Strep contacts: no  Context: stable Recurrent sinusitis: no Relief with OTC cold/cough medications: yes  Treatments attempted: cold/sinus    SHOULDER PAIN Reports Dr. Madaline Guthrie was supposed to refer him to ortho 2 years ago for right rotator cuff surgery.  Has been ongoing issue for 2 years and can not lift arm up more then 50 degrees or throw anything.   Duration: chronic Involved shoulder: right Mechanism of injury: unknown Location: diffuse Onset:gradual Severity: 9/10  Quality:  dull, aching, and throbbing Frequency: intermittent Radiation: no Aggravating factors: lifting, movement, and sleep  Alleviating factors: APAP and rest  Status: fluctuating Treatments attempted: rest, ice, APAP, and ibuprofen  Relief with NSAIDs?:  No NSAIDs Taken Weakness: yes Numbness: no Decreased grip strength: yes Redness: no Swelling: no Bruising: no Fevers: no   IFG Sugar on labs 04/07/21 == 167.  Reports neurology diagnosed him with mild cognitive changes in past --  history of multiple concussions and blood clots in past. Hypoglycemic episodes:no Polydipsia/polyuria: yes Visual disturbance: no Chest pain: no Paresthesias: no  CHRONIC KIDNEY DISEASE Noted on past labs on review. CKD status: stable Medications renally dose: yes Previous renal evaluation: no Pneumovax:  Not up to Date Influenza Vaccine:   ill today    BPH Has been out of Flomax for many  months.   Reports Dr. Soyla Dryer was to send him to urology, but retired prior to sending him -- has ongoing symptoms and burning with urination. BPH status: uncontrolled Satisfied with current treatment?: yes Medication side effects: no Medication compliance: good compliance Duration: chronic Nocturia: 4-5x per night Urinary frequency:yes Incomplete voiding: yes Urgency: yes Weak urinary stream: no Straining to start stream: yes Dysuria: yes Onset: gradual Severity: moderate Alleviating factors: Flomax Aggravating factors: none Treatments attempted: Flomax IPSS Questionnaire (AUA-7): 19 today Over the past month.   1)  How often have you had a sensation of not emptying your bladder completely after you finish urinating?  3 - About half the time  2)  How often have you had to urinate again less than two hours after you finished urinating? 3 - About half the time  3)  How often have you found you stopped and started again several times when you urinated?  3 - About half the time  4) How difficult have you found it to postpone urination?  3 - About half the time  5) How often have you had a weak urinary stream?  0 - Not at all  6) How often have you had to push or strain to begin urination?  3 - About half the time  7) How many times did you most typically get up to urinate from the time you went to bed until the time you got up in the morning?  4 - 4 times  Total score:  0-7 mildly symptomatic   8-19 moderately symptomatic   20-35 severely symptomatic    DEPRESSION Previously took Wellbutrin for mood and would like to restart. Mood status: uncontrolled Satisfied with current treatment?: yes Symptom severity: moderate  Duration of current treatment : chronic Side effects: no Medication compliance: good compliance Psychotherapy/counseling: none Previous psychiatric medications: Trazodone and Wellbutrin Depressed mood: yes Anxious mood: yes Anhedonia: no Significant weight loss or  gain: no Insomnia: yes hard to fall asleep Fatigue: no Feelings of worthlessness or guilt: no Impaired concentration/indecisiveness: no Suicidal ideations: no Hopelessness: no Crying spells: no Depression screen [facility] 2/9 10/04/2021  Decreased Interest 0  Down, Depressed, Hopeless 0  PHQ - 2 Score 0  Altered sleeping 3  Tired, decreased energy 1  Change in appetite 0  Feeling bad or failure about yourself  0  Trouble concentrating 3  Moving slowly or fidgety/restless 3  Suicidal thoughts 0  PHQ-9 Score 10     Past Medical History:  Diagnosis Date   ADHD    Asthma    COPD (chronic obstructive pulmonary disease) ([place])    Depression    Diabetes mellitus without complication ([place])    History of blood clots    "blood clots around heart 6 months ago"   Renal disorder    Short-term memory loss     Past Surgical History:  Procedure Laterality Date   APPENDECTOMY     FOOT SURGERY     HEMORRHOID SURGERY      Family History  Problem Relation Age of Onset   Cancer Mother    Hypertension Mother  Diabetes Father    Hypertension Father    Cancer Other     Social History   Socioeconomic History   Marital status: Single    Spouse name: Not on file   Number of children: Not on file   Years of education: Not on file   Highest education level: Not on file  Occupational History   Not on file  Tobacco Use   Smoking status: Some Days    Types: Cigarettes   Smokeless tobacco: Never  Substance and Sexual Activity   Alcohol use: Yes    Comment: social   Drug use: No   Sexual activity: Not on file  Other Topics Concern   Not on file  Social History Narrative   Not on file   Social Determinants of Health   Financial Resource Strain: Not on file  Food Insecurity: Not on file  Transportation Needs: Not on file  Physical Activity: Not on file  Stress: Not on file  Social Connections: Not on file  Intimate Partner Violence: Not on file    ROS Review of Systems   Constitutional:  Positive for fatigue and fever. Negative for activity change, appetite change, chills and diaphoresis.  HENT:  Positive for congestion, postnasal drip, rhinorrhea, sinus pressure, sneezing and sore throat. Negative for ear discharge, ear pain and sinus pain.   Respiratory:  Positive for cough, chest tightness, shortness of breath and wheezing.   Cardiovascular:  Negative for chest pain, palpitations and leg swelling.  Gastrointestinal: Negative.   Endocrine: Negative for cold intolerance, heat intolerance, polydipsia, polyphagia and polyuria.  Neurological: Negative.   Psychiatric/Behavioral: Negative.     Objective:   Today's Vitals: BP 102/68   Pulse 62   Temp 99 F (37.2 C) (Oral)   Ht 5\' 7"  (1.702 m)   Wt 166 lb 12.8 oz (75.7 kg)   SpO2 95%   BMI 26.12 kg/m   Physical Exam Vitals and nursing note reviewed.  Constitutional:      General: He is awake. He is not in acute distress.    Appearance: He is well-developed and well-groomed. He is not ill-appearing or toxic-appearing.  HENT:     Head: Normocephalic and atraumatic.     Right Ear: Hearing, ear canal and external ear normal. No drainage. A middle ear effusion is present.     Left Ear: Hearing, ear canal and external ear normal. No drainage. A middle ear effusion is present.     Nose: Rhinorrhea present. Rhinorrhea is clear.     Right Sinus: No maxillary sinus tenderness or frontal sinus tenderness.     Left Sinus: No maxillary sinus tenderness or frontal sinus tenderness.     Mouth/Throat:     Mouth: Mucous membranes are moist.     Pharynx: Uvula midline. Posterior oropharyngeal erythema (mild with cobblestoning) present. No pharyngeal swelling or oropharyngeal exudate.  Eyes:     General: Lids are normal.        Right eye: No discharge.        Left eye: No discharge.     Conjunctiva/sclera: Conjunctivae normal.     Pupils: Pupils are equal, round, and reactive to light.  Neck:     Thyroid: No  thyromegaly.     Vascular: No carotid bruit.     Trachea: Trachea normal.  Cardiovascular:     Rate and Rhythm: Normal rate and regular rhythm.     Heart sounds: Normal heart sounds, S1 normal and S2 normal. No murmur heard.  No gallop.  Pulmonary:     Effort: Pulmonary effort is normal. No accessory muscle usage or respiratory distress.     Breath sounds: Normal breath sounds.  Abdominal:     General: Bowel sounds are normal.     Palpations: Abdomen is soft. There is no hepatomegaly or splenomegaly.  Musculoskeletal:     Right shoulder: Tenderness present. No swelling, effusion, bony tenderness or crepitus. Decreased range of motion. Decreased strength. Normal pulse.     Left shoulder: Normal.     Cervical back: Normal range of motion and neck supple.     Right lower leg: No edema.     Left lower leg: No edema.  Lymphadenopathy:     Cervical: No cervical adenopathy.  Skin:    General: Skin is warm and dry.     Capillary Refill: Capillary refill takes less than 2 seconds.     Findings: No rash.  Neurological:     Mental Status: He is alert and oriented to person, place, and time.     Deep Tendon Reflexes: Reflexes are normal and symmetric.     Reflex Scores:      Brachioradialis reflexes are 2+ on the right side and 2+ on the left side.      Patellar reflexes are 2+ on the right side and 2+ on the left side. Psychiatric:        Attention and Perception: Attention normal.        Mood and Affect: Mood normal.        Speech: Speech normal.        Behavior: Behavior normal. Behavior is cooperative.        Thought Content: Thought content normal.    Assessment & Plan:   Problem List Items Addressed This Visit       Respiratory   Asthma in adult, mild intermittent, uncomplicated    Chronic, stable with no Albuterol use.  Continue current regimen and adjust as needed. Labs today: CBC, CMP today.  Recommend complete cessation of smoking.      Relevant Orders   CBC with  Differential/Platelet     Endocrine   IFG (impaired fasting glucose)    Noted on past labs on review, check CMP, urine ALB, and A1c today.  Start medication as needed based on results.      Relevant Orders   Comprehensive metabolic panel   HgB A1c   Microalbumin, Urine Waived     Other   Acute cough    Ongoing for > 8 days.  Is a smoker with underlying asthma.  Will send in Augmentin to take for 7 days and recommend use of Albuterol as needed + OTC cough medication.  Covid and flu testing today.  Recommend: - Increased rest - Increasing Fluids - Acetaminophen / ibuprofen as needed for fever/pain.  - Salt water gargling, chloraseptic spray and throat lozenges - Humidifying the air.      Relevant Orders   Veritor Flu A/B Waived   Novel Coronavirus, NAA ([facility])   Benign prostatic hyperplasia with nocturia    Chronic, ongoing.  At this time will restart Flomax due to ongoing symptoms and adjust regimen as needed.  Referral to urology placed, as was to attend in past.  Check labs today: PSA and UA.      Relevant Orders   Ambulatory referral to Urology   PSA   Urinalysis, Routine w reflex microscopic   Chronic right shoulder pain    Chronic, ongoing was to  see ortho in past but missed referral.  Will place referral to ortho today for ongoing pain from rotator cuff.      Relevant Medications   aspirin EC 81 MG tablet   buPROPion (WELLBUTRIN) 100 MG tablet   Other Relevant Orders   Ambulatory referral to Orthopedics   Depression, major, single episode, mild ([facility]CC) - Primary    Chronic, ongoing, previously on Wellbutrin.  Denies SI/HI.  PHQ9 = 10 today.  Will restart Wellbutrin and adjust regimen as needed.  This would benefit both mood and smoking cessation.  Return in 4 weeks.      Relevant Medications   buPROPion (WELLBUTRIN) 100 MG tablet   History of blood clots    History of reported -- will monitor closely and obtain imaging if needed.      Mild cognitive impairment     Reports history of neurology assessment and presence of mild cognitive impairment.  Monitor closely and return to neurology as needed.  History of multiple concussions.  Last CT head in May 2016.  Labs today B12, CBC, CMP, RPR, TSH, Vit D.      Relevant Orders   Lipid Panel w/o Chol/HDL Ratio   TSH   RPR   Vitamin B12   Other Visit Diagnoses     Vitamin D deficiency       History of low levels reported, recheck today and initiate supplement as needed.   Relevant Orders   VITAMIN D 25 Hydroxy (Vit-D Deficiency, Fractures)   Need for hepatitis C screening test       Hep C screening on labs today, discussed with patient.   Relevant Orders   Hepatitis C antibody   Encounter for screening for HIV       HIV screening on labs today, discussed with patient.   Relevant Orders   HIV Antibody (routine testing w rflx)   Colon cancer screening       Referral to GI for colonoscopy   Relevant Orders   Ambulatory referral to Gastroenterology   Encounter to establish care       New patient visit today.       Outpatient Encounter Medications as of 10/04/2021  Medication Sig   albuterol (PROVENTIL HFA;VENTOLIN HFA) 108 (90 Base) MCG/ACT inhaler Inhale into the lungs every 6 (six) hours as needed for wheezing or shortness of breath.   amoxicillin-clavulanate (AUGMENTIN) 875-125 MG tablet Take 1 tablet by mouth 2 (two) times daily for 7 days.   aspirin EC 81 MG tablet Take 81 mg by mouth daily. Swallow whole.   Cholecalciferol (VITAMIN D3) 25 MCG (1000 UT) CAPS Take 1 capsule (1,000 Units total) by mouth daily.   ibuprofen (ADVIL) 600 MG tablet Take 1 tablet (600 mg total) by mouth every 8 (eight) hours as needed.   [DISCONTINUED] buPROPion (WELLBUTRIN) 100 MG tablet Take 100 mg by mouth 2 (two) times daily.   [DISCONTINUED] cholecalciferol (VITAMIN D) 1000 units tablet Take 1,000 Units by mouth daily.   [DISCONTINUED] tamsulosin (FLOMAX) 0.4 MG CAPS capsule Take 1 capsule (0.4 mg total) by  mouth daily.   buPROPion (WELLBUTRIN) 100 MG tablet Take 1 tablet (100 mg total) by mouth 2 (two) times daily.   tamsulosin (FLOMAX) 0.4 MG CAPS capsule Take 1 capsule (0.4 mg total) by mouth daily.   [DISCONTINUED] ciprofloxacin (CIPRO) 500 MG tablet Take 1 tablet (500 mg total) by mouth 2 (two) times daily. (Patient not taking: Reported on 10/04/2021)   [DISCONTINUED] traMADol (ULTRAM) 50 MG  tablet Take 1 tablet (50 mg total) by mouth every 6 (six) hours as needed for moderate pain. (Patient not taking: Reported on 10/04/2021)   No facility-administered encounter medications on file as of 10/04/2021.    Follow-up: Return in about 4 weeks (around 11/01/2021) for IFG, MOOD, BPH.   Venita Lick, NP

## 2021-10-04 NOTE — Assessment & Plan Note (Signed)
Ongoing for > 8 days.  Is a smoker with underlying asthma.  Will send in Augmentin to take for 7 days and recommend use of Albuterol as needed + OTC cough medication.  Covid and flu testing today.  Recommend: - Increased rest - Increasing Fluids - Acetaminophen / ibuprofen as needed for fever/pain.  - Salt water gargling, chloraseptic spray and throat lozenges - Humidifying the air.

## 2021-10-04 NOTE — Assessment & Plan Note (Signed)
Noted on past labs on review, check CMP, urine ALB, and A1c today.  Start medication as needed based on results.

## 2021-10-04 NOTE — Addendum Note (Signed)
Addended by: Aura Dials T on: 10/04/2021 02:01 PM   Modules accepted: Orders

## 2021-10-04 NOTE — Assessment & Plan Note (Signed)
Chronic, ongoing was to see ortho in past but missed referral.  Will place referral to ortho today for ongoing pain from rotator cuff.

## 2021-10-05 ENCOUNTER — Telehealth: Payer: Self-pay | Admitting: Nurse Practitioner

## 2021-10-05 DIAGNOSIS — E538 Deficiency of other specified B group vitamins: Secondary | ICD-10-CM

## 2021-10-05 DIAGNOSIS — E559 Vitamin D deficiency, unspecified: Secondary | ICD-10-CM | POA: Insufficient documentation

## 2021-10-05 DIAGNOSIS — R768 Other specified abnormal immunological findings in serum: Secondary | ICD-10-CM

## 2021-10-05 LAB — CBC WITH DIFFERENTIAL/PLATELET
Basophils Absolute: 0 10*3/uL (ref 0.0–0.2)
Basos: 0 %
EOS (ABSOLUTE): 0.3 10*3/uL (ref 0.0–0.4)
Eos: 3 %
Hematocrit: 44.1 % (ref 37.5–51.0)
Hemoglobin: 14.8 g/dL (ref 13.0–17.7)
Immature Grans (Abs): 0 10*3/uL (ref 0.0–0.1)
Immature Granulocytes: 0 %
Lymphocytes Absolute: 1.5 10*3/uL (ref 0.7–3.1)
Lymphs: 19 %
MCH: 32.3 pg (ref 26.6–33.0)
MCHC: 33.6 g/dL (ref 31.5–35.7)
MCV: 96 fL (ref 79–97)
Monocytes Absolute: 0.9 10*3/uL (ref 0.1–0.9)
Monocytes: 12 %
Neutrophils Absolute: 5 10*3/uL (ref 1.4–7.0)
Neutrophils: 66 %
Platelets: 316 10*3/uL (ref 150–450)
RBC: 4.58 x10E6/uL (ref 4.14–5.80)
RDW: 11.4 % — ABNORMAL LOW (ref 11.6–15.4)
WBC: 7.7 10*3/uL (ref 3.4–10.8)

## 2021-10-05 LAB — COMPREHENSIVE METABOLIC PANEL
ALT: 35 IU/L (ref 0–44)
AST: 45 IU/L — ABNORMAL HIGH (ref 0–40)
Albumin/Globulin Ratio: 1.3 (ref 1.2–2.2)
Albumin: 3.9 g/dL (ref 3.8–4.9)
Alkaline Phosphatase: 74 IU/L (ref 44–121)
BUN/Creatinine Ratio: 10 (ref 9–20)
BUN: 8 mg/dL (ref 6–24)
Bilirubin Total: 0.4 mg/dL (ref 0.0–1.2)
CO2: 23 mmol/L (ref 20–29)
Calcium: 9.1 mg/dL (ref 8.7–10.2)
Chloride: 105 mmol/L (ref 96–106)
Creatinine, Ser: 0.82 mg/dL (ref 0.76–1.27)
Globulin, Total: 3.1 g/dL (ref 1.5–4.5)
Glucose: 85 mg/dL (ref 70–99)
Potassium: 4.3 mmol/L (ref 3.5–5.2)
Sodium: 141 mmol/L (ref 134–144)
Total Protein: 7 g/dL (ref 6.0–8.5)
eGFR: 101 mL/min/{1.73_m2} (ref >59)

## 2021-10-05 LAB — SARS-COV-2, NAA 2 DAY TAT

## 2021-10-05 LAB — HEMOGLOBIN A1C
Est. average glucose Bld gHb Est-mCnc: 111 mg/dL
Hgb A1c MFr Bld: 5.5 % (ref 4.8–5.6)

## 2021-10-05 LAB — TSH: TSH: 0.823 u[IU]/mL (ref 0.450–4.500)

## 2021-10-05 LAB — PSA: Prostate Specific Ag, Serum: 2.1 ng/mL (ref 0.0–4.0)

## 2021-10-05 LAB — VITAMIN D 25 HYDROXY (VIT D DEFICIENCY, FRACTURES): Vit D, 25-Hydroxy: 13.7 ng/mL — ABNORMAL LOW (ref 30.0–100.0)

## 2021-10-05 LAB — HEPATITIS C ANTIBODY: Hep C Virus Ab: >11 s/co ratio — ABNORMAL HIGH (ref 0.0–0.9)

## 2021-10-05 LAB — VITAMIN B12: Vitamin B-12: 325 pg/mL (ref 232–1245)

## 2021-10-05 LAB — NOVEL CORONAVIRUS, NAA: SARS-CoV-2, NAA: DETECTED — AB

## 2021-10-05 LAB — LIPID PANEL W/O CHOL/HDL RATIO
Cholesterol, Total: 155 mg/dL (ref 100–199)
HDL: 55 mg/dL (ref >39)
LDL Chol Calc (NIH): 80 mg/dL (ref 0–99)
Triglycerides: 111 mg/dL (ref 0–149)
VLDL Cholesterol Cal: 20 mg/dL (ref 5–40)

## 2021-10-05 LAB — HIV ANTIBODY (ROUTINE TESTING W REFLEX): HIV Screen 4th Generation wRfx: NONREACTIVE

## 2021-10-05 LAB — RPR: RPR Ser Ql: NONREACTIVE

## 2021-10-05 MED ORDER — VITAMIN B-12 1000 MCG PO TABS: 1000.0000 ug | ORAL_TABLET | Freq: Every day | ORAL | 4 refills | Status: AC

## 2021-10-05 MED ORDER — CHOLECALCIFEROL 1.25 MG (50000 UT) PO TABS
1.0000 | ORAL_TABLET | ORAL | 4 refills | Status: AC
Start: 2021-10-05 — End: ?

## 2021-10-05 NOTE — Telephone Encounter (Signed)
Spoke to patient on telephone at 1700 to review labs: - Hep C antibody >11.  Discussed with patient need for referral to GI for further assessment and possible treatment.  He was homeless for a period and does endorses history of crack and cocaine use during that time.  History of plasma donation years ago.  Educated him on lab findings. HIV negative.  LFT's overall stable with one mild elevation. - Vitamin D and B12 levels on lower or lower normal side, will send in supplements for these, discussed with patient. - Covid testing positive. He has had symptoms >7 days, past treatment period.  Flu negative.  Recommend he ensure he wear mask at this time around others until symptoms have improved.   - Remainder of labs stable and reviewed.  He stated appreciation for call and all questions answered.

## 2021-10-05 NOTE — Progress Notes (Signed)
Notified via telephone, refer to telephone note dated 10/05/21.

## 2021-10-07 LAB — URINE CULTURE: Organism ID, Bacteria: NO GROWTH

## 2021-10-10 ENCOUNTER — Ambulatory Visit: Payer: Self-pay | Admitting: Nurse Practitioner

## 2021-10-10 ENCOUNTER — Other Ambulatory Visit (INDEPENDENT_AMBULATORY_CARE_PROVIDER_SITE_OTHER): Payer: Self-pay

## 2021-10-10 ENCOUNTER — Telehealth: Payer: Self-pay

## 2021-10-10 DIAGNOSIS — Z1211 Encounter for screening for malignant neoplasm of colon: Secondary | ICD-10-CM

## 2021-10-10 MED ORDER — PEG 3350-KCL-NA BICARB-NACL 420 G PO SOLR: 4000.0000 mL | Freq: Once | ORAL | 0 refills | Status: AC

## 2021-10-10 NOTE — Progress Notes (Signed)
Gastroenterology Pre-Procedure Review  Request Date: 11/15/2021 Requesting Physician: Dr. Tobi Bastos  PATIENT REVIEW QUESTIONS: The patient responded to the following health history questions as indicated:    1. Are you having any GI issues? no 2. Do you have a personal history of Polyps? no 3. Do you have a family history of Colon Cancer or Polyps? yes (family bone cancer and prostate and colon cancer) 4. Diabetes Mellitus? no 5. Joint replacements in the past 12 months?no 6. Major health problems in the past 3 months?no 7. Any artificial heart valves, MVP, or defibrillator?no    MEDICATIONS & ALLERGIES:    Patient reports the following regarding taking any anticoagulation/antiplatelet therapy:   Plavix, Coumadin, Eliquis, Xarelto, Lovenox, Pradaxa, Brilinta, or Effient? no Aspirin? yes (81mg )  Patient confirms/reports the following medications:  Current Outpatient Medications  Medication Sig Dispense Refill   albuterol (PROVENTIL HFA;VENTOLIN HFA) 108 (90 Base) MCG/ACT inhaler Inhale into the lungs every 6 (six) hours as needed for wheezing or shortness of breath.     amoxicillin-clavulanate (AUGMENTIN) 875-125 MG tablet Take 1 tablet by mouth 2 (two) times daily for 7 days. 14 tablet 0   aspirin EC 81 MG tablet Take 81 mg by mouth daily. Swallow whole.     buPROPion (WELLBUTRIN) 100 MG tablet Take 1 tablet (100 mg total) by mouth 2 (two) times daily. 180 tablet 4   Cholecalciferol 1.25 MG (50000 UT) TABS Take 1 tablet by mouth once a week. 12 tablet 4   tamsulosin (FLOMAX) 0.4 MG CAPS capsule Take 1 capsule (0.4 mg total) by mouth daily. 90 capsule 4   vitamin B-12 (CYANOCOBALAMIN) 1000 MCG tablet Take 1 tablet (1,000 mcg total) by mouth daily. 90 tablet 4   No current facility-administered medications for this visit.    Patient confirms/reports the following allergies:  No Known Allergies  No orders of the defined types were placed in this encounter.   AUTHORIZATION  INFORMATION Primary Insurance: 1D#: Group #:  Secondary Insurance: 1D#: Group #:  SCHEDULE INFORMATION: Date:  Time: Location:

## 2021-10-10 NOTE — Telephone Encounter (Signed)
Scheduled for colonoscopy. 

## 2021-10-31 ENCOUNTER — Ambulatory Visit: Payer: Medicaid Other | Admitting: Nurse Practitioner

## 2021-11-11 ENCOUNTER — Ambulatory Visit: Payer: Medicaid Other | Admitting: Urology

## 2021-11-11 ENCOUNTER — Encounter: Payer: Self-pay | Admitting: Urology

## 2021-11-11 NOTE — Progress Notes (Incomplete)
11/11/2021 8:17 AM   Dominic Pea [birth year] [id number]  Referring provider: Marjie Skiff, NP [address] [place],  [place] [address]  No chief complaint on file.   HPI:    PMH: Past Medical History:  Diagnosis Date   ADHD    Asthma    COPD (chronic obstructive pulmonary disease) (HCC)    Depression    Diabetes mellitus without complication (HCC)    History of blood clots    "blood clots around heart 6 months ago"   Renal disorder    Short-term memory loss     Surgical History: Past Surgical History:  Procedure Laterality Date   APPENDECTOMY     FOOT SURGERY     HEMORRHOID SURGERY      Home Medications:  Allergies as of 11/11/2021   No Known Allergies      Medication List        Accurate as of November 11, 2021  8:17 AM. If you have any questions, ask your nurse or doctor.          albuterol 108 (90 Base) MCG/ACT inhaler Commonly known as: VENTOLIN HFA Inhale into the lungs every 6 (six) hours as needed for wheezing or shortness of breath.   aspirin EC 81 MG tablet Take 81 mg by mouth daily. Swallow whole.   buPROPion 100 MG tablet Commonly known as: WELLBUTRIN Take 1 tablet (100 mg total) by mouth 2 (two) times daily.   Cholecalciferol 1.25 MG (50000 UT) Tabs Take 1 tablet by mouth once a week.   tamsulosin 0.4 MG Caps capsule Commonly known as: FLOMAX Take 1 capsule (0.4 mg total) by mouth daily.   vitamin B-12 1000 MCG tablet Commonly known as: CYANOCOBALAMIN Take 1 tablet (1,000 mcg total) by mouth daily.        Allergies: No Known Allergies  Family History: Family History  Problem Relation Age of Onset   Cancer Mother    Hypertension Mother    Diabetes Father    Hypertension Father    Cancer Other     Social History:  reports that he has been smoking cigarettes. He has never used smokeless tobacco. He reports current alcohol use. He reports that he does not use drugs.   Physical Exam: There were no vitals taken  for this visit.  Constitutional:  Alert and oriented, No acute distress. HEENT: [place] AT, moist mucus membranes.  Trachea midline, no masses. Cardiovascular: No clubbing, cyanosis, or edema. Respiratory: Normal respiratory effort, no increased work of breathing. GI: Abdomen is soft, nontender, nondistended, no abdominal masses GU: No CVA tenderness Skin: No rashes, bruises or suspicious lesions. Neurologic: Grossly intact, no focal deficits, moving all 4 extremities. Psychiatric: Normal mood and affect.  Laboratory Data: Lab Results  Component Value Date   WBC 7.7 10/04/2021   HGB 14.8 10/04/2021   HCT 44.1 10/04/2021   MCV 96 10/04/2021   PLT 316 10/04/2021    Lab Results  Component Value Date   CREATININE 0.82 10/04/2021    No results found for: PSA  No results found for: TESTOSTERONE  Lab Results  Component Value Date   HGBA1C 5.5 10/04/2021    Urinalysis    Component Value Date/Time   COLORURINE Yellow 10/20/2014 2134   COLORURINE YELLOW 12/03/2010 1000   APPEARANCEUR Cloudy (A) 10/04/2021 1153   LABSPEC 1.020 11/27/2016 1401   LABSPEC 1.010 10/20/2014 2134   PHURINE 7.0 11/27/2016 1401   GLUCOSEU Negative 10/04/2021 1153   GLUCOSEU Negative  10/20/2014 2134   HGBUR TRACE (A) 11/27/2016 1401   BILIRUBINUR Negative 10/04/2021 1153   BILIRUBINUR Negative 10/20/2014 2134   KETONESUR NEGATIVE 11/27/2016 1401   PROTEINUR 1+ (A) 10/04/2021 1153   PROTEINUR NEGATIVE 11/27/2016 1401   UROBILINOGEN 1.0 11/27/2016 1401   NITRITE Positive (A) 10/04/2021 1153   NITRITE NEGATIVE 11/27/2016 1401   LEUKOCYTESUR 1+ (A) 10/04/2021 1153   LEUKOCYTESUR 3+ 10/20/2014 2134    Lab Results  Component Value Date   LABMICR See below: 10/04/2021   WBCUA 6-10 (A) 10/04/2021   LABEPIT 0-10 10/04/2021   MUCUS Present (A) 10/04/2021   BACTERIA Many (A) 10/04/2021    Pertinent Imaging: *** No results found for this or any previous visit.  No results found for this or any  previous visit.  No results found for this or any previous visit.  No results found for this or any previous visit.  No results found for this or any previous visit.  No results found for this or any previous visit.  No results found for this or any previous visit.  No results found for this or any previous visit.   Assessment & Plan:    There are no diagnoses linked to this encounter.  No follow-ups on file.  Riki Altes, MD  [facility] [address] [place], [place] [address] [phone]

## 2021-11-15 ENCOUNTER — Other Ambulatory Visit: Payer: Self-pay

## 2021-11-15 ENCOUNTER — Ambulatory Visit: Payer: Medicaid Other | Admitting: Certified Registered Nurse Anesthetist

## 2021-11-15 ENCOUNTER — Encounter
Admission: RE | Disposition: A | Discharge status: Home / Self Care | Payer: Self-pay | Source: Ambulatory Visit | Attending: Gastroenterology

## 2021-11-15 ENCOUNTER — Encounter: Payer: Self-pay | Admitting: Gastroenterology

## 2021-11-15 ENCOUNTER — Ambulatory Visit
Admission: RE | Admit: 2021-11-15 | Discharge: 2021-11-15 | Disposition: A | Discharge status: Home / Self Care | Payer: Medicaid Other | Source: Ambulatory Visit | Attending: Gastroenterology | Admitting: Gastroenterology

## 2021-11-15 DIAGNOSIS — Z1211 Encounter for screening for malignant neoplasm of colon: Secondary | ICD-10-CM | POA: Insufficient documentation

## 2021-11-15 DIAGNOSIS — F17210 Nicotine dependence, cigarettes, uncomplicated: Secondary | ICD-10-CM | POA: Insufficient documentation

## 2021-11-15 DIAGNOSIS — E119 Type 2 diabetes mellitus without complications: Secondary | ICD-10-CM | POA: Diagnosis not present

## 2021-11-15 HISTORY — PX: COLONOSCOPY WITH PROPOFOL: SHX5780

## 2021-11-15 SURGERY — COLONOSCOPY WITH PROPOFOL
Anesthesia: General

## 2021-11-15 MED ORDER — LIDOCAINE HCL (CARDIAC) PF 100 MG/5ML IV SOSY
PREFILLED_SYRINGE | INTRAVENOUS | Status: DC | PRN
  Administered 2021-11-15: 50 mg via INTRAVENOUS

## 2021-11-15 MED ORDER — PROPOFOL 500 MG/50ML IV EMUL
INTRAVENOUS | Status: DC | PRN
  Administered 2021-11-15: 150 ug/kg/min via INTRAVENOUS

## 2021-11-15 MED ORDER — PROPOFOL 10 MG/ML IV BOLUS
INTRAVENOUS | Status: DC | PRN
Start: 2021-11-15 — End: 2021-11-15
  Administered 2021-11-15: 60 mg via INTRAVENOUS
  Administered 2021-11-15: 40 mg via INTRAVENOUS

## 2021-11-15 MED ORDER — SODIUM CHLORIDE 0.9 % IV SOLN: INTRAVENOUS | Status: DC

## 2021-11-15 NOTE — Anesthesia Postprocedure Evaluation (Signed)
Anesthesia Post Note  Patient: Jesse Lam  Procedure(s) Performed: COLONOSCOPY WITH PROPOFOL  Patient location during evaluation: Endoscopy Anesthesia Type: General Level of consciousness: awake and alert Pain management: pain level controlled Vital Signs Assessment: post-procedure vital signs reviewed and stable Respiratory status: spontaneous breathing, nonlabored ventilation, respiratory function stable and patient connected to nasal cannula oxygen Cardiovascular status: blood pressure returned to baseline and stable Postop Assessment: no apparent nausea or vomiting Anesthetic complications: no   No notable events documented.   Last Vitals:  Vitals:   11/15/21 0811 11/15/21 0821  BP: 121/85 128/82  Pulse:    Resp:    Temp:    SpO2:      Last Pain:  Vitals:   11/15/21 0831  TempSrc:   PainSc: 0-No pain                 Precious Haws Stepen Prins

## 2021-11-15 NOTE — H&P (Signed)
Jonathon Bellows, MD [address] [place], [place], [place], [address] [address], [place], [place], [place], [address] Phone: [phone]  Fax: [phone]  Primary Care Physician:  Venita Lick, NP   Pre-Procedure History & Physical: HPI:  NIKALAS CHIMENTO is a 61 y.o. male is here for an colonoscopy.   Past Medical History:  Diagnosis Date   ADHD    Asthma    COPD (chronic obstructive pulmonary disease) ([place])    Depression    Diabetes mellitus without complication ([place])    History of blood clots    "blood clots around heart 6 months ago"   Renal disorder    Short-term memory loss     Past Surgical History:  Procedure Laterality Date   APPENDECTOMY     FOOT SURGERY     HEMORRHOID SURGERY      Prior to Admission medications   Medication Sig Start Date End Date Taking? Authorizing Provider  albuterol (PROVENTIL HFA;VENTOLIN HFA) 108 (90 Base) MCG/ACT inhaler Inhale into the lungs every 6 (six) hours as needed for wheezing or shortness of breath.    [provider]  aspirin EC 81 MG tablet Take 81 mg by mouth daily. Swallow whole.    [provider]  buPROPion (WELLBUTRIN) 100 MG tablet Take 1 tablet (100 mg total) by mouth 2 (two) times daily. 10/04/21   Cannady, Henrine Screws T, NP  Cholecalciferol 1.25 MG (50000 UT) TABS Take 1 tablet by mouth once a week. 10/05/21   Cannady, Henrine Screws T, NP  tamsulosin (FLOMAX) 0.4 MG CAPS capsule Take 1 capsule (0.4 mg total) by mouth daily. 10/04/21   Cannady, Henrine Screws T, NP  vitamin B-12 (CYANOCOBALAMIN) 1000 MCG tablet Take 1 tablet (1,000 mcg total) by mouth daily. 10/05/21   Marnee Guarneri T, NP    Allergies as of 10/10/2021   (No Known Allergies)    Family History  Problem Relation Age of Onset   Cancer Mother    Hypertension Mother    Diabetes Father    Hypertension Father    Cancer Other     Social History   Socioeconomic History   Marital status: Single    Spouse name: Not on file   Number  of children: Not on file   Years of education: Not on file   Highest education level: Not on file  Occupational History   Not on file  Tobacco Use   Smoking status: Some Days    Types: Cigarettes   Smokeless tobacco: Never  Substance and Sexual Activity   Alcohol use: Yes    Comment: social   Drug use: No   Sexual activity: Not on file  Other Topics Concern   Not on file  Social History Narrative   Not on file   Social Determinants of Health   Financial Resource Strain: Not on file  Food Insecurity: Not on file  Transportation Needs: Not on file  Physical Activity: Not on file  Stress: Not on file  Social Connections: Not on file  Intimate Partner Violence: Not on file    Review of Systems: See HPI, otherwise negative ROS  Physical Exam: BP 123/69    Pulse (!) 47    Temp (!) 96.2 F (35.7 C) (Temporal)    Resp 18    SpO2 100%  General:   Alert,  pleasant and cooperative in NAD Head:  Normocephalic and atraumatic. Neck:  Supple; no masses or thyromegaly. Lungs:  Clear throughout to auscultation,  normal respiratory effort.    Heart:  +S1, +S2, Regular rate and rhythm, No edema. Abdomen:  Soft, nontender and nondistended. Normal bowel sounds, without guarding, and without rebound.   Neurologic:  Alert and  oriented x4;  grossly normal neurologically.  Impression/Plan: WENCES DELPOZO is here for an colonoscopy to be performed for Screening colonoscopy average risk   Risks, benefits, limitations, and alternatives regarding  colonoscopy have been reviewed with the patient.  Questions have been answered.  All parties agreeable.   Jonathon Bellows, MD  11/15/2021, 7:43 AM

## 2021-11-15 NOTE — Op Note (Signed)
[facility] Gastroenterology Patient Name: Jesse Lam Procedure Date: 11/15/2021 7:46 AM MRN: [id number] Account #: [id number] Date of Birth: 09-Feb-1961 Admit Type: Outpatient Age: 61 Room: [facility] ENDO ROOM 2 Gender: Male Note Status: Finalized Instrument Name: Jasper Riling [id number] Procedure:             Colonoscopy Indications:           Screening for colorectal malignant neoplasm Providers:             Jonathon Bellows MD, MD Referring MD:          Barbaraann Faster. Ned Card (Referring MD) Medicines:             Monitored Anesthesia Care Complications:         No immediate complications. Procedure:             Pre-Anesthesia Assessment:                        - Prior to the procedure, a History and Physical was                         performed, and patient medications, allergies and                         sensitivities were reviewed. The patient's tolerance                         of previous anesthesia was reviewed.                        - The risks and benefits of the procedure and the                         sedation options and risks were discussed with the                         patient. All questions were answered and informed                         consent was obtained.                        - ASA Grade Assessment: II - A patient with mild                         systemic disease.                        After obtaining informed consent, the colonoscope was                         passed under direct vision. Throughout the procedure,                         the patient's blood pressure, pulse, and oxygen                         saturations were monitored continuously. The                         Colonoscope was introduced  through the anus and                         advanced to the the cecum, identified by the                         appendiceal orifice. The colonoscopy was performed                         without difficulty. The patient tolerated the                          procedure well. The quality of the bowel preparation                         was poor. Findings:      The perianal and digital rectal examinations were normal.      A large amount of semi-liquid stool was found in the entire colon,       interfering with visualization. Impression:            - Preparation of the colon was poor.                        - Stool in the entire examined colon.                        - No specimens collected. Recommendation:        - Discharge patient to home (with escort).                        - Resume previous diet.                        - Continue present medications.                        - Repeat colonoscopy in 2 months because the bowel                         preparation was suboptimal. Procedure Code(s):     --- Professional ---                        [phone], Colonoscopy, flexible; diagnostic, including                         collection of specimen(s) by brushing or washing, when                         performed (separate procedure) Diagnosis Code(s):     --- Professional ---                        Z12.11, Encounter for screening for malignant neoplasm                         of colon CPT copyright 2019 American Medical Association. All rights reserved. The codes documented in this report are preliminary and upon coder review may  be revised to meet current compliance requirements. Jonathon Bellows, MD Jonathon Bellows MD, MD 11/15/2021 8:00:05 AM  This report has been signed electronically. Number of Addenda: 0 Note Initiated On: 11/15/2021 7:46 AM Scope Withdrawal Time: 0 hours 5 minutes 25 seconds  Total Procedure Duration: 0 hours 6 minutes 55 seconds  Estimated Blood Loss:  Estimated blood loss: none.      [facility]

## 2021-11-15 NOTE — Transfer of Care (Signed)
Immediate Anesthesia Transfer of Care Note  Patient: Dominic Pea  Procedure(s) Performed: COLONOSCOPY WITH PROPOFOL  Patient Location: Endoscopy Unit  Anesthesia Type:General  Level of Consciousness: drowsy  Airway & Oxygen Therapy: Patient Spontanous Breathing  Post-op Assessment: Report given to RN and Post -op Vital signs reviewed and stable  Post vital signs: Reviewed and stable  Last Vitals:  Vitals Value Taken Time  BP 102/64 11/15/21 0801  Temp    Pulse 59 11/15/21 0801  Resp 20 11/15/21 0801  SpO2 100 % 11/15/21 0801  Vitals shown include unvalidated device data.  Last Pain:  Vitals:   11/15/21 0728  TempSrc: Temporal  PainSc: 0-No pain         Complications: No notable events documented.

## 2021-11-15 NOTE — Anesthesia Preprocedure Evaluation (Signed)
Anesthesia Evaluation  Patient identified by MRN, date of birth, ID band Patient awake    Reviewed: Allergy & Precautions, NPO status , Patient's Chart, lab work & pertinent test results  History of Anesthesia Complications Negative for: history of anesthetic complications  Airway Mallampati: III  TM Distance: >3 FB Neck ROM: full    Dental  (+) Chipped, Missing, Poor Dentition, Loose   Pulmonary asthma , COPD, Current Smoker and Patient abstained from smoking.,    Pulmonary exam normal        Cardiovascular Exercise Tolerance: Good negative cardio ROS Normal cardiovascular exam     Neuro/Psych PSYCHIATRIC DISORDERS negative neurological ROS  negative psych ROS   GI/Hepatic negative GI ROS, Neg liver ROS,   Endo/Other  diabetes, Type 2  Renal/GU Renal disease  negative genitourinary   Musculoskeletal   Abdominal   Peds  Hematology negative hematology ROS (+)   Anesthesia Other Findings Past Medical History: No date: ADHD No date: Asthma No date: COPD (chronic obstructive pulmonary disease) (HCC) No date: Depression No date: Diabetes mellitus without complication (HCC) No date: History of blood clots     Comment:  "blood clots around heart 6 months ago" No date: Renal disorder   Past Surgical History: No date: APPENDECTOMY No date: FOOT SURGERY No date: HEMORRHOID SURGERY     Reproductive/Obstetrics negative OB ROS                             Anesthesia Physical Anesthesia Plan  ASA: 3  Anesthesia Plan: General   Post-op Pain Management:    Induction: Intravenous  PONV Risk Score and Plan: Propofol infusion and TIVA  Airway Management Planned: Natural Airway and Nasal Cannula  Additional Equipment:   Intra-op Plan:   Post-operative Plan:   Informed Consent: I have reviewed the patients History and Physical, chart, labs and discussed the procedure including the  risks, benefits and alternatives for the proposed anesthesia with the patient or authorized representative who has indicated his/her understanding and acceptance.     Dental Advisory Given  Plan Discussed with: Anesthesiologist, CRNA and Surgeon  Anesthesia Plan Comments: (Patient consented for risks of anesthesia including but not limited to:  - adverse reactions to medications - risk of airway placement if required - damage to eyes, teeth, lips or other oral mucosa - nerve damage due to positioning  - sore throat or hoarseness - Damage to heart, brain, nerves, lungs, other parts of body or loss of life  Patient voiced understanding.)        Anesthesia Quick Evaluation

## 2021-11-16 ENCOUNTER — Encounter: Payer: Self-pay | Admitting: Gastroenterology

## 2021-11-16 ENCOUNTER — Ambulatory Visit: Payer: Medicaid Other | Admitting: Gastroenterology

## 2021-11-27 DIAGNOSIS — Z8616 Personal history of COVID-19: Secondary | ICD-10-CM | POA: Insufficient documentation

## 2021-11-29 ENCOUNTER — Ambulatory Visit: Payer: Medicaid Other | Admitting: Nurse Practitioner

## 2021-11-29 DIAGNOSIS — R768 Other specified abnormal immunological findings in serum: Secondary | ICD-10-CM

## 2021-11-29 DIAGNOSIS — E559 Vitamin D deficiency, unspecified: Secondary | ICD-10-CM

## 2021-12-25 NOTE — Patient Instructions (Incomplete)

## 2021-12-26 ENCOUNTER — Ambulatory Visit: Payer: Medicaid Other | Admitting: Nurse Practitioner

## 2021-12-26 DIAGNOSIS — R768 Other specified abnormal immunological findings in serum: Secondary | ICD-10-CM

## 2021-12-26 DIAGNOSIS — E559 Vitamin D deficiency, unspecified: Secondary | ICD-10-CM

## 2021-12-26 DIAGNOSIS — F320 Major depressive disorder, single episode, mild: Secondary | ICD-10-CM

## 2021-12-31 ENCOUNTER — Emergency Department: Payer: Medicaid Other

## 2021-12-31 ENCOUNTER — Other Ambulatory Visit: Payer: Self-pay

## 2021-12-31 ENCOUNTER — Encounter: Payer: Self-pay | Admitting: Emergency Medicine

## 2021-12-31 ENCOUNTER — Emergency Department
Admission: EM | Admit: 2021-12-31 | Discharge: 2021-12-31 | Disposition: A | Discharge status: Home / Self Care | Payer: Medicaid Other | Attending: Emergency Medicine | Admitting: Emergency Medicine

## 2021-12-31 DIAGNOSIS — N50812 Left testicular pain: Secondary | ICD-10-CM | POA: Insufficient documentation

## 2021-12-31 DIAGNOSIS — N451 Epididymitis: Secondary | ICD-10-CM | POA: Diagnosis not present

## 2021-12-31 DIAGNOSIS — R109 Unspecified abdominal pain: Secondary | ICD-10-CM | POA: Diagnosis not present

## 2021-12-31 DIAGNOSIS — N5089 Other specified disorders of the male genital organs: Secondary | ICD-10-CM | POA: Diagnosis not present

## 2021-12-31 DIAGNOSIS — N433 Hydrocele, unspecified: Secondary | ICD-10-CM | POA: Diagnosis not present

## 2021-12-31 DIAGNOSIS — D72829 Elevated white blood cell count, unspecified: Secondary | ICD-10-CM | POA: Diagnosis not present

## 2021-12-31 DIAGNOSIS — R1032 Left lower quadrant pain: Secondary | ICD-10-CM | POA: Diagnosis not present

## 2021-12-31 LAB — BASIC METABOLIC PANEL
Anion gap: 10 (ref 5–15)
BUN: 9 mg/dL (ref 6–20)
CO2: 25 mmol/L (ref 22–32)
Calcium: 9.1 mg/dL (ref 8.9–10.3)
Chloride: 100 mmol/L (ref 98–111)
Creatinine, Ser: 0.84 mg/dL (ref 0.61–1.24)
GFR, Estimated: >60 mL/min (ref >60)
Glucose, Bld: 130 mg/dL — ABNORMAL HIGH (ref 70–99)
Potassium: 3.8 mmol/L (ref 3.5–5.1)
Sodium: 135 mmol/L (ref 135–145)

## 2021-12-31 LAB — CBC
HCT: 44.1 % (ref 39.0–52.0)
Hemoglobin: 14.4 g/dL (ref 13.0–17.0)
MCH: 31.7 pg (ref 26.0–34.0)
MCHC: 32.7 g/dL (ref 30.0–36.0)
MCV: 97.1 fL (ref 80.0–100.0)
Platelets: 284 10*3/uL (ref 150–400)
RBC: 4.54 MIL/uL (ref 4.22–5.81)
RDW: 11.1 % — ABNORMAL LOW (ref 11.5–15.5)
WBC: 13.4 10*3/uL — ABNORMAL HIGH (ref 4.0–10.5)
nRBC: 0 % (ref 0.0–0.2)

## 2021-12-31 LAB — URINALYSIS, COMPLETE (UACMP) WITH MICROSCOPIC
Bilirubin Urine: NEGATIVE
Glucose, UA: NEGATIVE mg/dL
Ketones, ur: NEGATIVE mg/dL
Nitrite: POSITIVE — AB
Protein, ur: 30 mg/dL — AB
Specific Gravity, Urine: 1.016 (ref 1.005–1.030)
WBC, UA: >50 WBC/hpf — ABNORMAL HIGH (ref 0–5)
pH: 5 (ref 5.0–8.0)

## 2021-12-31 LAB — HEPATIC FUNCTION PANEL
ALT: 29 U/L (ref 0–44)
AST: 25 U/L (ref 15–41)
Albumin: 3.6 g/dL (ref 3.5–5.0)
Alkaline Phosphatase: 65 U/L (ref 38–126)
Bilirubin, Direct: 0.3 mg/dL — ABNORMAL HIGH (ref 0.0–0.2)
Indirect Bilirubin: 1 mg/dL — ABNORMAL HIGH (ref 0.3–0.9)
Total Bilirubin: 1.3 mg/dL — ABNORMAL HIGH (ref 0.3–1.2)
Total Protein: 7.6 g/dL (ref 6.5–8.1)

## 2021-12-31 LAB — CHLAMYDIA/NGC RT PCR (ARMC ONLY)
Chlamydia Tr: NOT DETECTED
N gonorrhoeae: NOT DETECTED

## 2021-12-31 LAB — LIPASE, BLOOD: Lipase: 26 U/L (ref 11–51)

## 2021-12-31 MED ORDER — ONDANSETRON HCL 4 MG/2ML IJ SOLN
4.0000 mg | Freq: Once | INTRAMUSCULAR | Status: AC
  Administered 2021-12-31: 4 mg via INTRAVENOUS
  Filled 2021-12-31: qty 2

## 2021-12-31 MED ORDER — IOHEXOL 300 MG/ML  SOLN
100.0000 mL | Freq: Once | INTRAMUSCULAR | Status: AC | PRN
  Administered 2021-12-31: 100 mL via INTRAVENOUS
  Filled 2021-12-31: qty 100

## 2021-12-31 MED ORDER — HYDROMORPHONE HCL 1 MG/ML IJ SOLN
0.5000 mg | Freq: Once | INTRAMUSCULAR | Status: AC
  Administered 2021-12-31: 0.5 mg via INTRAVENOUS
  Filled 2021-12-31: qty 1

## 2021-12-31 MED ORDER — SODIUM CHLORIDE 0.9 % IV SOLN
1.0000 g | Freq: Once | INTRAVENOUS | Status: AC
  Administered 2021-12-31: 1 g via INTRAVENOUS
  Filled 2021-12-31: qty 10

## 2021-12-31 MED ORDER — LEVOFLOXACIN 500 MG PO TABS: 500.0000 mg | ORAL_TABLET | Freq: Every day | ORAL | 0 refills | Status: AC
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
## 2021-12-31 NOTE — ED Triage Notes (Signed)
Pt via POV from home. Pt c/o LLQ pain for the past week, pt states that 4 days the pain increased and now has pain in L testicle pain and swelling. Pt endorses diarrhea. Pt also endorse painful urination. States that he now for the past week he has been unable to ejaculate. Pt is A&OX4 and NAD.

## 2021-12-31 NOTE — ED Notes (Signed)
CLEAN AND DIRTY URINE SENT TO LAB

## 2021-12-31 NOTE — Discharge Instructions (Addendum)
°  Take the antibiotics due to concern for an infection.  Please call the urology doctors to get follow-up.  He can take ibuprofen 600 mg every 8 hours as needed with food and and Tylenol 1 g every 8 hours as needed.  Return to the ER for worsening pain, fevers or any other concerns     IMPRESSION: 1. No acute findings in the abdomen or pelvis. 2. Right-sided bladder diverticulum with dependent 7 mm irregular stone versus a cluster adjacent of layering tiny stones. 3. Prominent vascular anatomy in the left inguinal canal is compatible with epididymitis seen on ultrasound earlier today. 4. Left colonic diverticulosis without diverticulitis.

## 2021-12-31 NOTE — ED Notes (Signed)
pT TO ULTRASOUND.

## 2021-12-31 NOTE — ED Notes (Signed)
Patient declined discharge vital signs. 

## 2021-12-31 NOTE — ED Notes (Signed)
Pt in scans. °

## 2021-12-31 NOTE — ED Notes (Signed)
Pt in scans, will obtain VS when returns.

## 2022-01-02 ENCOUNTER — Telehealth: Payer: Self-pay | Admitting: *Deleted

## 2022-01-02 LAB — URINE CULTURE: Culture: >=100000 — AB

## 2022-01-02 NOTE — Telephone Encounter (Signed)
Transition Care Management Follow-up Telephone Call Date of discharge and from where: 12/31/2021 [facility] How have you been since you were released from the hospital? "Doing a little better" Any questions or concerns? No  Items Reviewed: Did the pt receive and understand the discharge instructions provided? Yes  Medications obtained and verified? Yes  Other? No  Any new allergies since your discharge? No  Dietary orders reviewed? No Do you have support at home? Yes    Functional Questionnaire: (I = Independent and D = Dependent) ADLs: I  Bathing/Dressing- I  Meal Prep- I  Eating- I  Maintaining continence- I  Transferring/Ambulation- I  Managing Meds- I  Follow up appointments reviewed:  PCP Hospital f/u appt confirmed? Yes  Scheduled to see Jesse Guarneri, NP on 01/19/2022 @ 0920. [facility] f/u appt confirmed? No   Are transportation arrangements needed? No  If their condition worsens, is the pt aware to call PCP or go to the Emergency Dept.? Yes Was the patient provided with contact information for the PCP's office or ED? Yes Was to pt encouraged to call back with questions or concerns? Yes

## 2022-01-19 ENCOUNTER — Ambulatory Visit: Payer: Medicaid Other | Admitting: Nurse Practitioner

## 2022-01-31 IMAGING — CR DG FOREARM 2V*R*
1 series · 2 of 2 positions shown · non-contrast
Comparison: None.

CLINICAL DATA: Pain

EXAM:
RIGHT FOREARM - 2 VIEW

[Series 1: dg forearm right · 0.14mm/px · 2 of 2 slices shown]
[im 1/2]
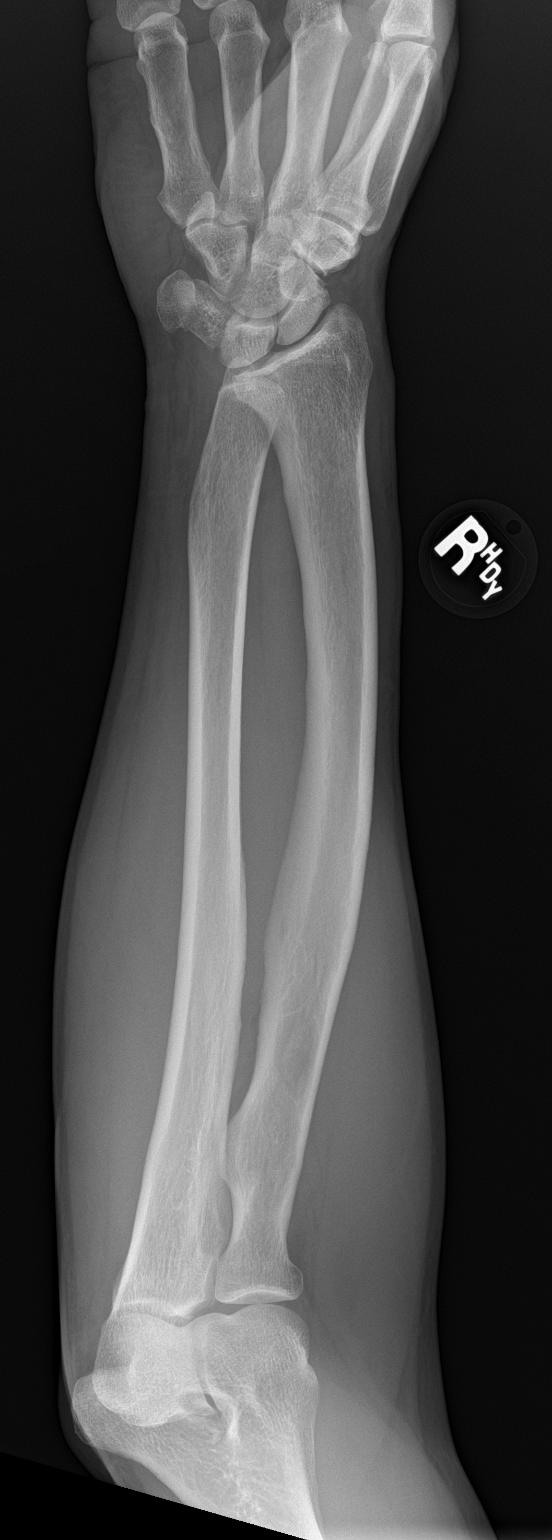
[im 2/2]
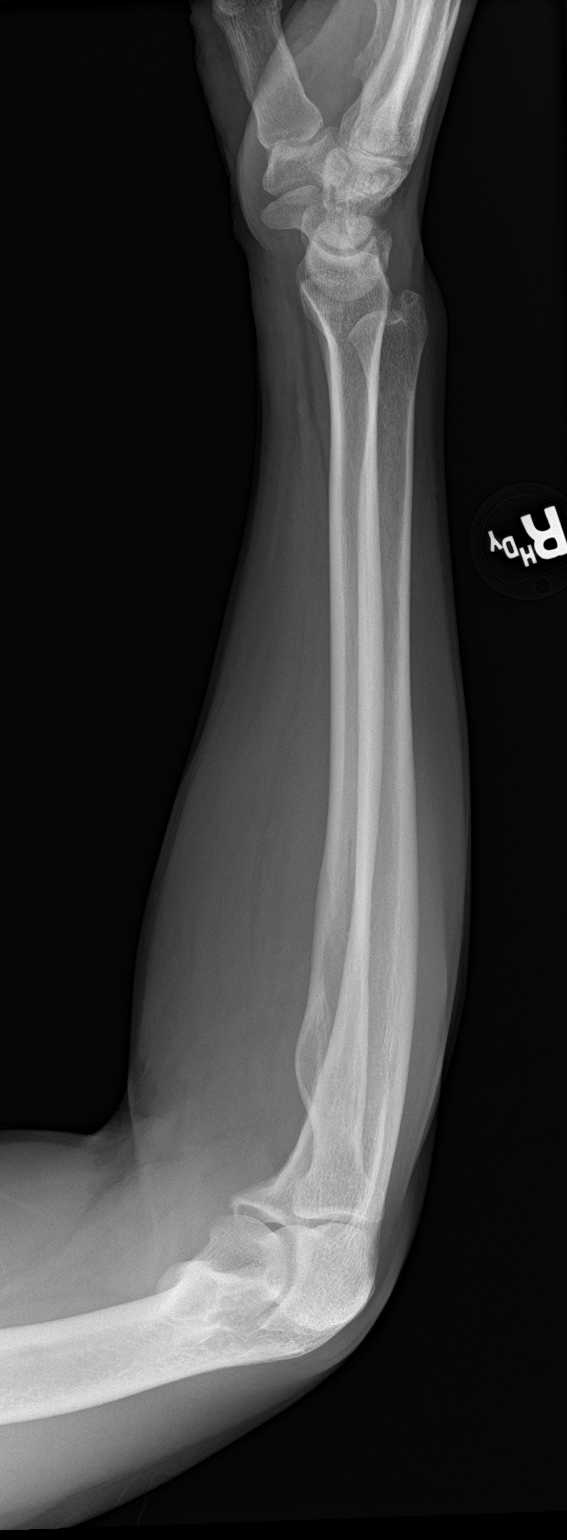

[2 of 2 positions shown; findings below may reference images not displayed]

FINDINGS: There is no evidence of fracture or other focal bone lesions. Soft
tissues are unremarkable.
IMPRESSION: Negative.

## 2022-01-31 IMAGING — CR DG RIBS W/ CHEST 3+V*R*
1 series · 3 of 3 positions shown · non-contrast
Comparison: November 27, 2016

CLINICAL DATA: Pain

EXAM:
RIGHT RIBS AND CHEST - 3+ VIEW

[Series 1: dg ribs unilateral w/chest left · 0.14mm/px · 3 of 3 slices shown]
[im 1/3]
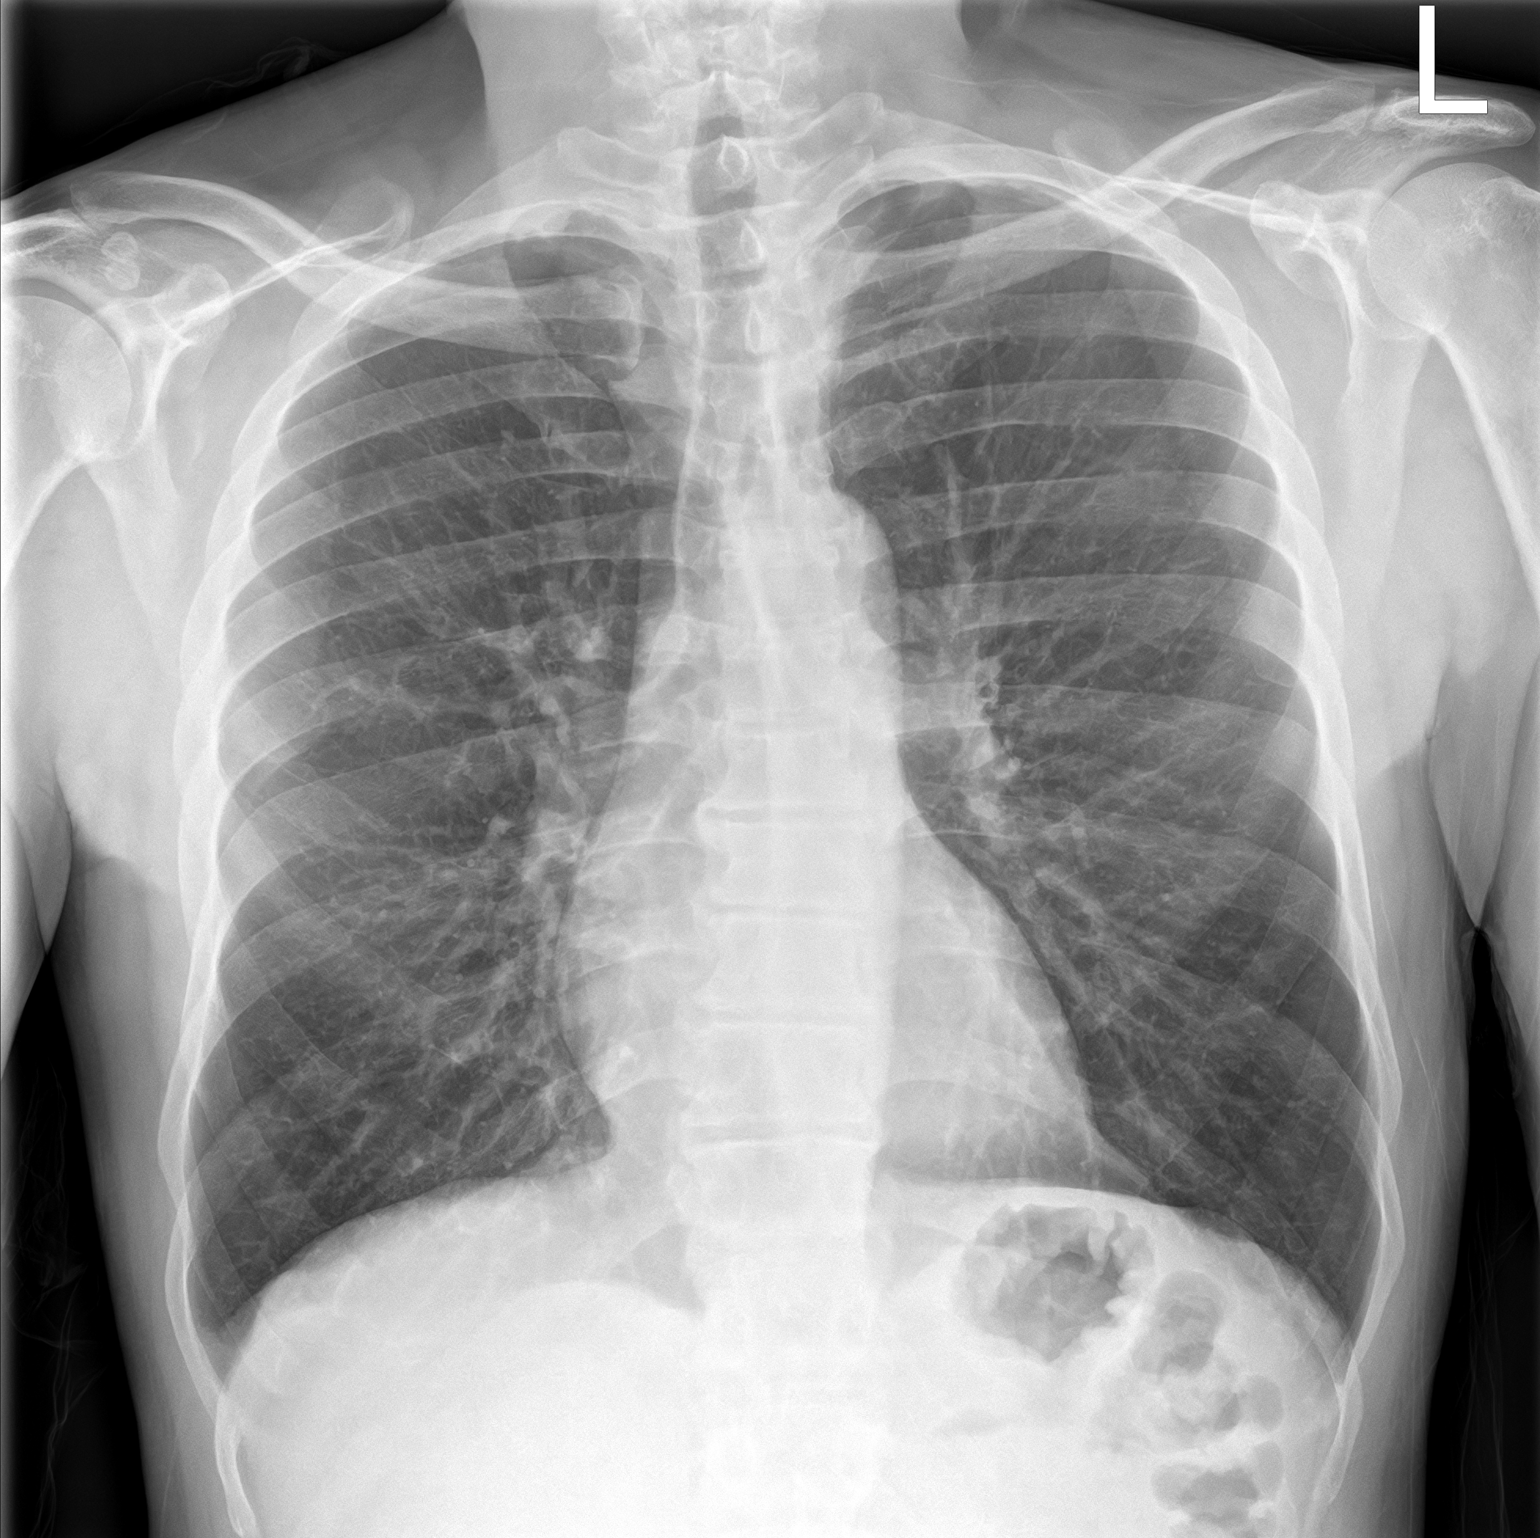
[im 2/3]
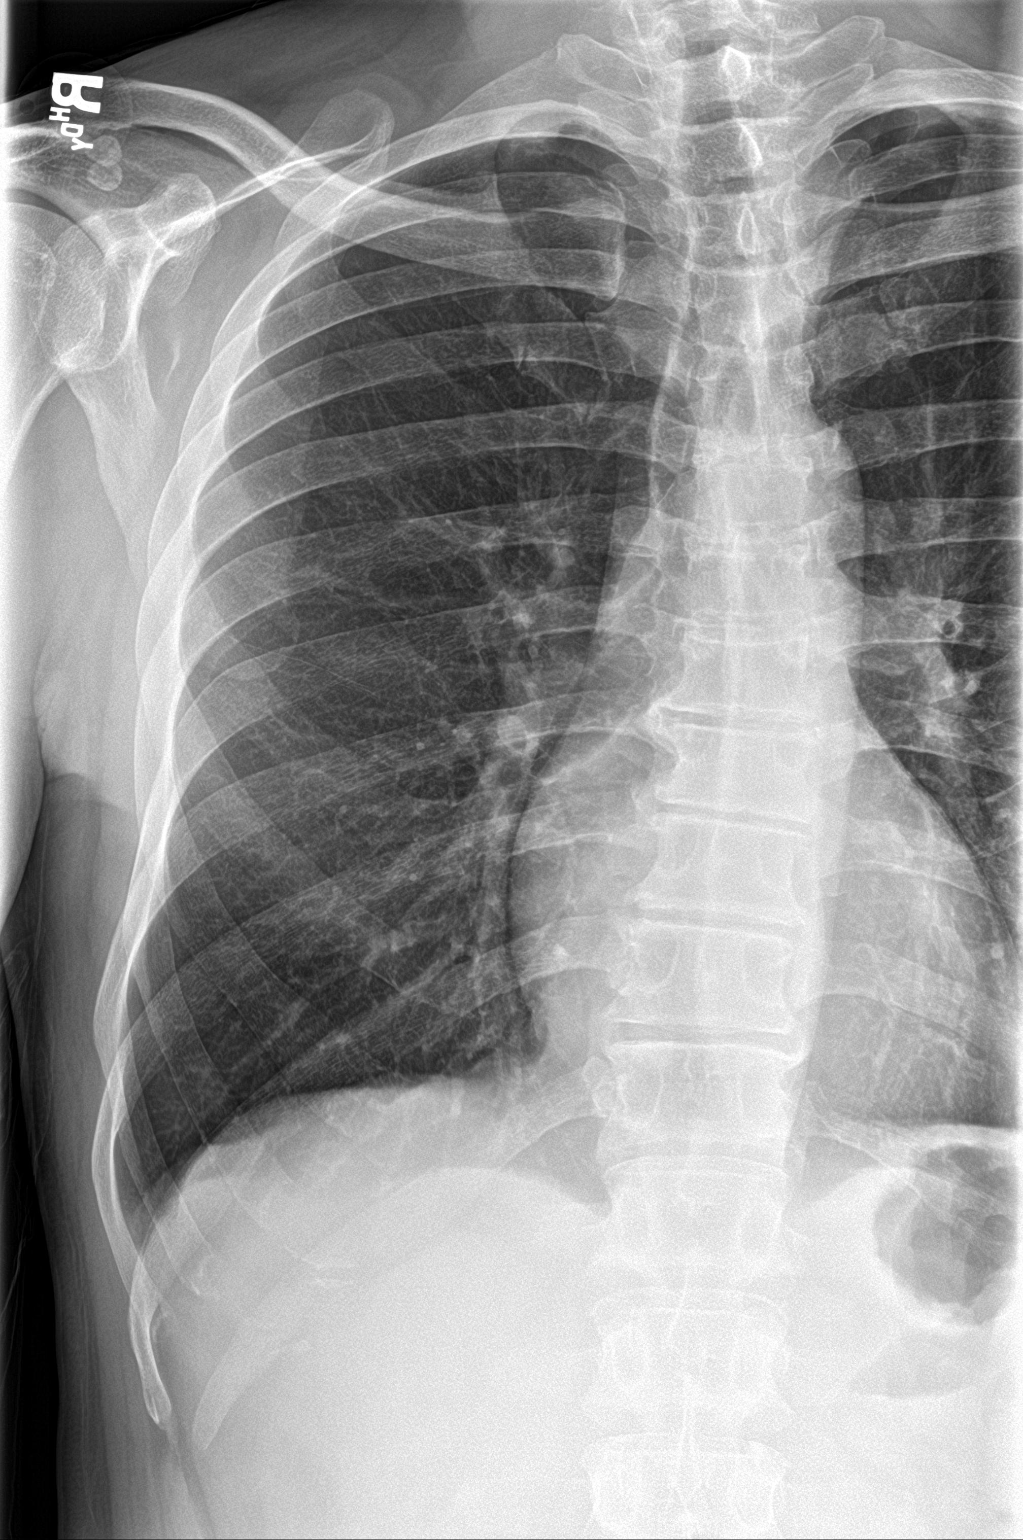
[im 3/3]
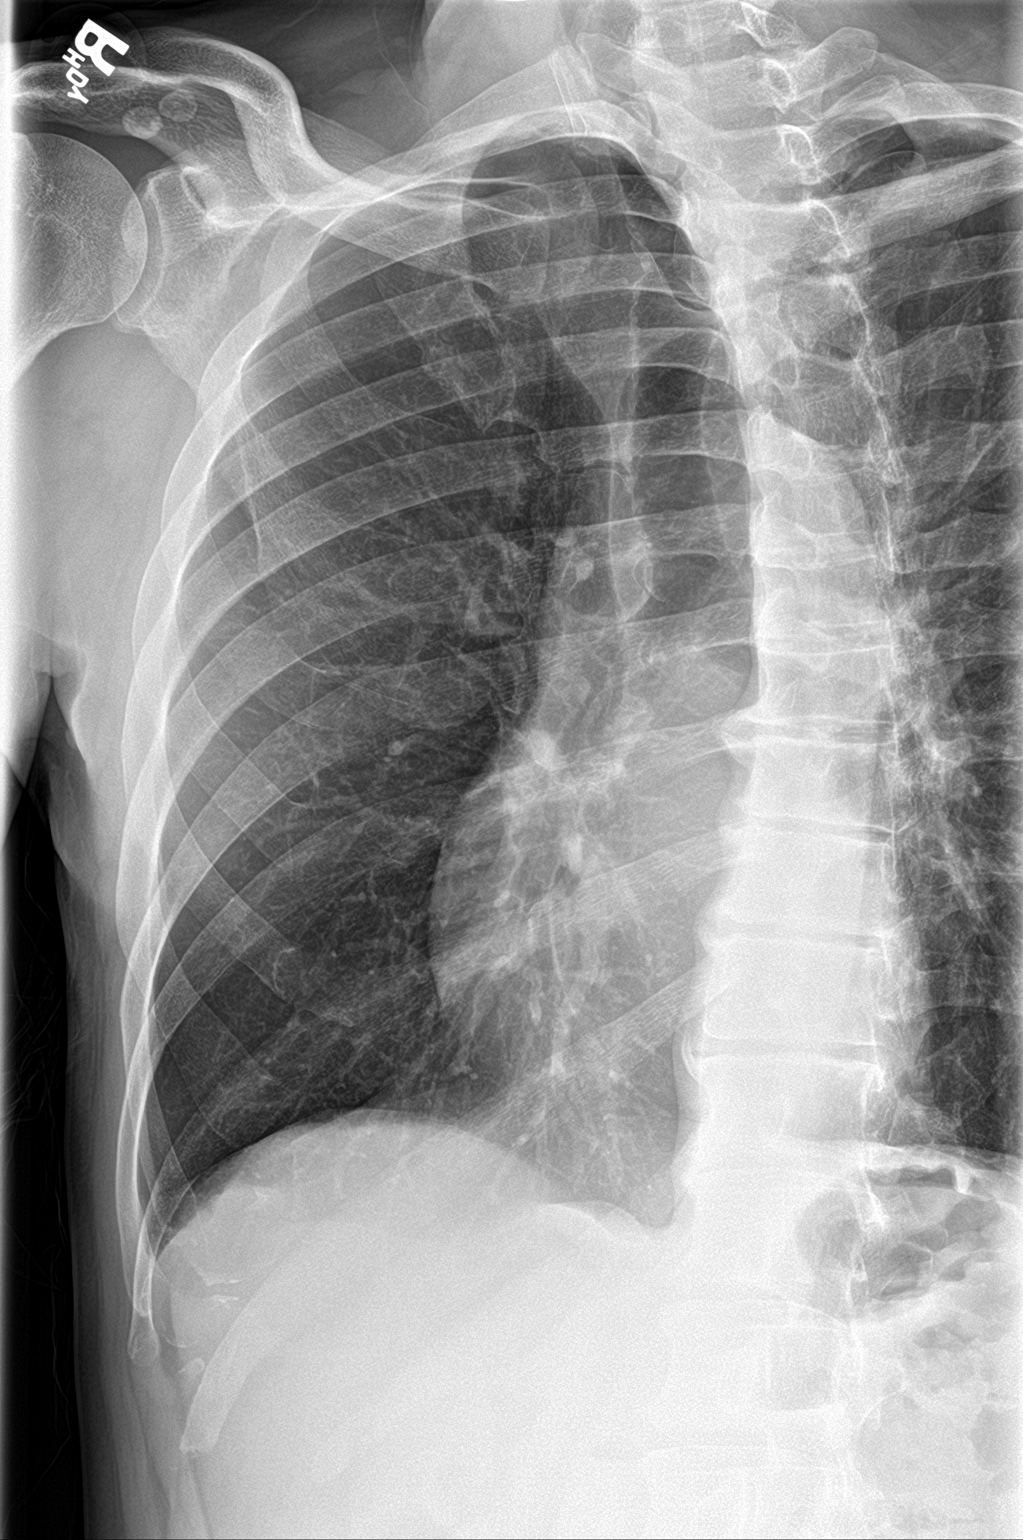

[3 of 3 positions shown; findings below may reference images not displayed]

FINDINGS: No fracture or other bone lesions are seen involving the ribs. There
is no evidence of pneumothorax or pleural effusion. Both lungs are
clear. Heart size and mediastinal contours are within normal limits.
There are intra-articular loose bodies in the right glenohumeral
joint space. Of note, the patient's palpable area of concern was
outside the field of view and thus likely does not correspond to the
patient's ribs.
IMPRESSION: 1. No acute displaced rib fracture.
2. No acute cardiopulmonary process.
3. Intra-articular loose bodies in the right glenohumeral joint
space.

## 2022-01-31 IMAGING — CR DG FOREARM 2V*L*
1 series · 2 of 2 positions shown · non-contrast
Comparison: None.

CLINICAL DATA: Pain

EXAM:
LEFT FOREARM - 2 VIEW

[Series 1: dg forearm left · 0.14mm/px · 2 of 2 slices shown]
[im 1/2]
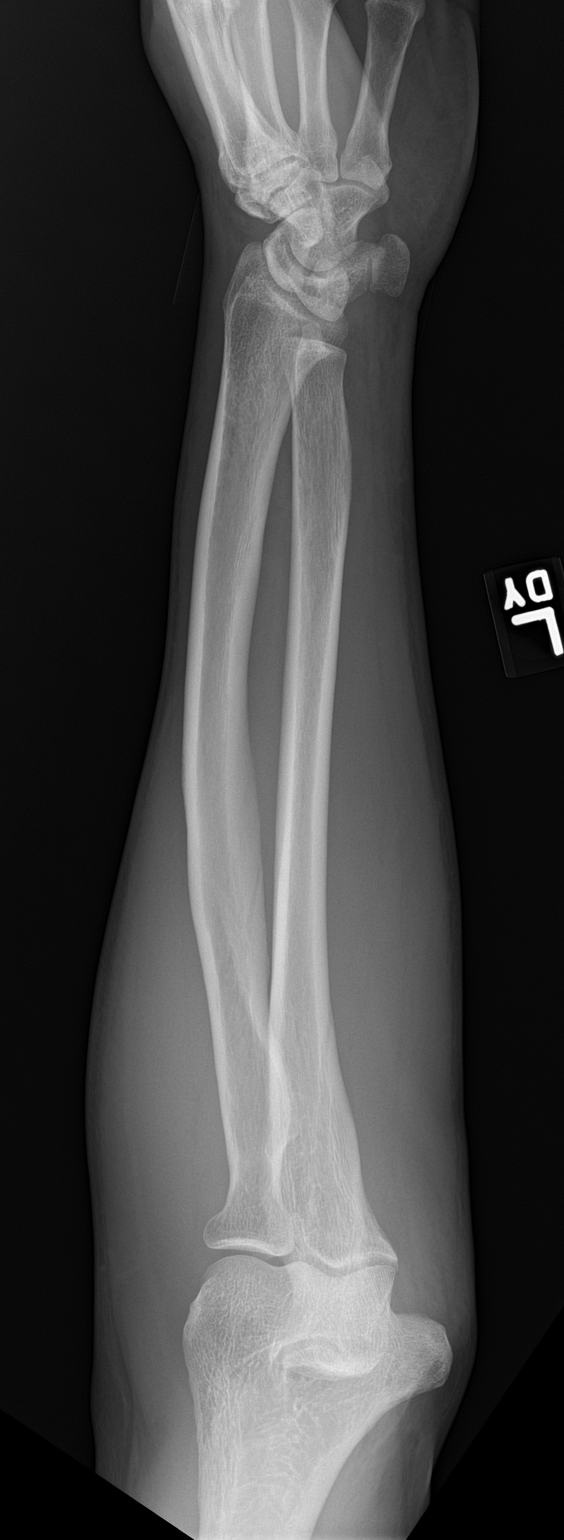
[im 2/2]
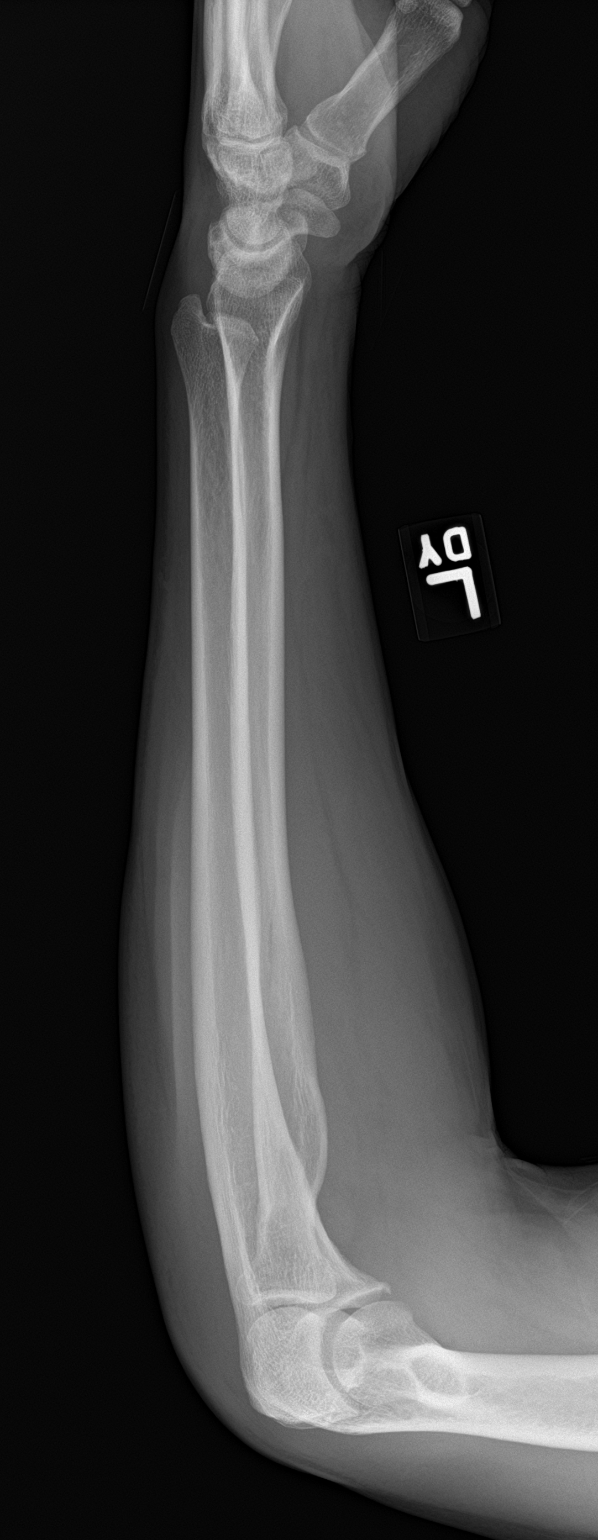

[2 of 2 positions shown; findings below may reference images not displayed]

FINDINGS: There is no evidence of fracture or other focal bone lesions. Soft
tissues are unremarkable.
IMPRESSION: Negative.

## 2022-03-03 DIAGNOSIS — S199XXA Unspecified injury of neck, initial encounter: Secondary | ICD-10-CM | POA: Diagnosis not present

## 2022-03-03 DIAGNOSIS — Z20822 Contact with and (suspected) exposure to covid-19: Secondary | ICD-10-CM | POA: Diagnosis not present

## 2022-03-03 DIAGNOSIS — S3992XA Unspecified injury of lower back, initial encounter: Secondary | ICD-10-CM | POA: Diagnosis not present

## 2022-03-03 DIAGNOSIS — S82451C Displaced comminuted fracture of shaft of right fibula, initial encounter for open fracture type IIIA, IIIB, or IIIC: Secondary | ICD-10-CM | POA: Diagnosis not present

## 2022-03-03 DIAGNOSIS — S0990XA Unspecified injury of head, initial encounter: Secondary | ICD-10-CM | POA: Diagnosis not present

## 2022-03-03 DIAGNOSIS — S3993XA Unspecified injury of pelvis, initial encounter: Secondary | ICD-10-CM | POA: Diagnosis not present

## 2022-03-03 DIAGNOSIS — S8991XA Unspecified injury of right lower leg, initial encounter: Secondary | ICD-10-CM | POA: Diagnosis not present

## 2022-03-03 DIAGNOSIS — Z87892 Personal history of anaphylaxis: Secondary | ICD-10-CM | POA: Diagnosis not present

## 2022-03-03 DIAGNOSIS — J449 Chronic obstructive pulmonary disease, unspecified: Secondary | ICD-10-CM | POA: Diagnosis not present

## 2022-03-03 DIAGNOSIS — S82001A Unspecified fracture of right patella, initial encounter for closed fracture: Secondary | ICD-10-CM | POA: Diagnosis not present

## 2022-03-03 DIAGNOSIS — S82391A Other fracture of lower end of right tibia, initial encounter for closed fracture: Secondary | ICD-10-CM | POA: Diagnosis not present

## 2022-03-03 DIAGNOSIS — S82831A Other fracture of upper and lower end of right fibula, initial encounter for closed fracture: Secondary | ICD-10-CM | POA: Diagnosis not present

## 2022-03-03 DIAGNOSIS — R52 Pain, unspecified: Secondary | ICD-10-CM | POA: Diagnosis not present

## 2022-03-03 DIAGNOSIS — S3991XA Unspecified injury of abdomen, initial encounter: Secondary | ICD-10-CM | POA: Diagnosis not present

## 2022-03-03 DIAGNOSIS — R58 Hemorrhage, not elsewhere classified: Secondary | ICD-10-CM | POA: Diagnosis not present

## 2022-03-03 DIAGNOSIS — S92321A Displaced fracture of second metatarsal bone, right foot, initial encounter for closed fracture: Secondary | ICD-10-CM | POA: Diagnosis not present

## 2022-03-03 DIAGNOSIS — S92333A Displaced fracture of third metatarsal bone, unspecified foot, initial encounter for closed fracture: Secondary | ICD-10-CM | POA: Diagnosis not present

## 2022-03-03 DIAGNOSIS — S99911A Unspecified injury of right ankle, initial encounter: Secondary | ICD-10-CM | POA: Diagnosis not present

## 2022-03-03 DIAGNOSIS — S8992XA Unspecified injury of left lower leg, initial encounter: Secondary | ICD-10-CM | POA: Diagnosis not present

## 2022-03-03 DIAGNOSIS — S299XXA Unspecified injury of thorax, initial encounter: Secondary | ICD-10-CM | POA: Diagnosis not present

## 2022-03-03 DIAGNOSIS — Y33XXXA Other specified events, undetermined intent, initial encounter: Secondary | ICD-10-CM | POA: Diagnosis not present

## 2022-03-03 DIAGNOSIS — S82251A Displaced comminuted fracture of shaft of right tibia, initial encounter for closed fracture: Secondary | ICD-10-CM | POA: Diagnosis not present

## 2022-03-03 DIAGNOSIS — S92311A Displaced fracture of first metatarsal bone, right foot, initial encounter for closed fracture: Secondary | ICD-10-CM | POA: Diagnosis not present

## 2022-03-03 DIAGNOSIS — S82831B Other fracture of upper and lower end of right fibula, initial encounter for open fracture type I or II: Secondary | ICD-10-CM | POA: Diagnosis not present

## 2022-03-03 DIAGNOSIS — S92334A Nondisplaced fracture of third metatarsal bone, right foot, initial encounter for closed fracture: Secondary | ICD-10-CM | POA: Diagnosis not present

## 2022-03-03 DIAGNOSIS — S92324A Nondisplaced fracture of second metatarsal bone, right foot, initial encounter for closed fracture: Secondary | ICD-10-CM | POA: Diagnosis not present

## 2022-03-03 DIAGNOSIS — Z91018 Allergy to other foods: Secondary | ICD-10-CM | POA: Diagnosis not present

## 2022-03-03 DIAGNOSIS — R001 Bradycardia, unspecified: Secondary | ICD-10-CM | POA: Diagnosis not present

## 2022-03-03 DIAGNOSIS — S82251C Displaced comminuted fracture of shaft of right tibia, initial encounter for open fracture type IIIA, IIIB, or IIIC: Secondary | ICD-10-CM | POA: Diagnosis not present

## 2022-03-03 DIAGNOSIS — Z8679 Personal history of other diseases of the circulatory system: Secondary | ICD-10-CM | POA: Diagnosis not present

## 2022-03-04 DIAGNOSIS — S82251C Displaced comminuted fracture of shaft of right tibia, initial encounter for open fracture type IIIA, IIIB, or IIIC: Secondary | ICD-10-CM | POA: Diagnosis not present

## 2022-03-04 DIAGNOSIS — S82451C Displaced comminuted fracture of shaft of right fibula, initial encounter for open fracture type IIIA, IIIB, or IIIC: Secondary | ICD-10-CM | POA: Diagnosis not present

## 2022-03-04 DIAGNOSIS — S82831C Other fracture of upper and lower end of right fibula, initial encounter for open fracture type IIIA, IIIB, or IIIC: Secondary | ICD-10-CM | POA: Diagnosis not present

## 2022-03-04 DIAGNOSIS — S82391C Other fracture of lower end of right tibia, initial encounter for open fracture type IIIA, IIIB, or IIIC: Secondary | ICD-10-CM | POA: Diagnosis not present

## 2022-03-05 DIAGNOSIS — S82209B Unspecified fracture of shaft of unspecified tibia, initial encounter for open fracture type I or II: Secondary | ICD-10-CM | POA: Insufficient documentation

## 2022-03-06 DIAGNOSIS — Y33XXXA Other specified events, undetermined intent, initial encounter: Secondary | ICD-10-CM | POA: Diagnosis not present

## 2022-03-06 DIAGNOSIS — S92321A Displaced fracture of second metatarsal bone, right foot, initial encounter for closed fracture: Secondary | ICD-10-CM | POA: Diagnosis not present

## 2022-03-06 DIAGNOSIS — S82831A Other fracture of upper and lower end of right fibula, initial encounter for closed fracture: Secondary | ICD-10-CM | POA: Diagnosis not present

## 2022-03-06 DIAGNOSIS — S92331A Displaced fracture of third metatarsal bone, right foot, initial encounter for closed fracture: Secondary | ICD-10-CM | POA: Diagnosis not present

## 2022-03-07 DIAGNOSIS — S82301C Unspecified fracture of lower end of right tibia, initial encounter for open fracture type IIIA, IIIB, or IIIC: Secondary | ICD-10-CM | POA: Diagnosis not present

## 2022-03-07 DIAGNOSIS — S82451C Displaced comminuted fracture of shaft of right fibula, initial encounter for open fracture type IIIA, IIIB, or IIIC: Secondary | ICD-10-CM | POA: Diagnosis not present

## 2022-03-07 DIAGNOSIS — S82251C Displaced comminuted fracture of shaft of right tibia, initial encounter for open fracture type IIIA, IIIB, or IIIC: Secondary | ICD-10-CM | POA: Diagnosis not present

## 2022-03-07 DIAGNOSIS — Y33XXXA Other specified events, undetermined intent, initial encounter: Secondary | ICD-10-CM | POA: Diagnosis not present

## 2022-03-07 DIAGNOSIS — S82831C Other fracture of upper and lower end of right fibula, initial encounter for open fracture type IIIA, IIIB, or IIIC: Secondary | ICD-10-CM | POA: Diagnosis not present

## 2022-03-12 DIAGNOSIS — Z6740 Type O blood, Rh positive: Secondary | ICD-10-CM | POA: Insufficient documentation

## 2022-03-12 NOTE — Patient Instructions (Incomplete)
Hepatitis C Testing ?Why am I having this test? ?Hepatitis C testing is done to check for a liver infection caused by the hepatitis C virus (HCV). A person may have one or more hepatitis C tests done to: ?Help the health care provider diagnose HCV infection. This is if a person has possible signs of infection or has been exposed to HCV. ?Find the cause of long-term (chronic) liver disease or abnormal liver function test results. ?See if a person has had hepatitis C in the past. ?Hepatitis C is usually diagnosed with three blood tests: ?Anti-HCV test, also called the HCV antibody test. ?HCV RNA test. ?HCV genotype test. ?If a person is diagnosed with a current (active) HCV infection, he or she may have another test done to help monitor the condition during treatment. This test is called the quantitative HCV RNA test. ?What is being tested? ?Each HCV test measures the amounts of different substances in your blood. ?The anti-HCV test checks for proteins that your body makes to fight HCV (antibodies). If you have antibodies to HCV, it means you have been infected with hepatitis C. It does not necessarily mean that you have an active infection. ?The HCV RNA test checks for genetic material from HCV. This test is done if your HCV antibody test is positive and your health care provider wants to find out if you have an active infection. ?The HCV genotype test. This test identifies the type (genotype) of virus you have. ?The quantitative HCV RNA test measures the amount of virus in your blood (viral load). ?What kind of sample is taken? ? ?A blood sample is required for HCV tests. It is usually collected by inserting a needle into a vein in the arm. ?How are the results reported? ?Anti-HCV test results are reported as either positive or negative for HCV antibodies. ?HCV RNA test results are reported as either positive or negative for HCV genetic material. ?HCV genotype test results are reported as which genotype of the virus  you have. Genotypes are numbered 1 through 6. ?Quantitative HCV RNA test results are reported as a number that indicates your viral load. This is given as international units of virus per milliliter of blood (IU/mL). ?A result of 800,000 IU/mL or greater is considered a high viral load. ?A result of less than 800,000 IU/mL is considered a low viral load. ?Sometimes, results from the anti-HCV test or the HCV RNA test may report that: ?HCV antibodies or genetic material are present when they are not present (false-positive result). ?HCV antibodies or genetic material are not present when they are present (false-negative result). ?What do the results mean? ?For the anti-HCV test: ?A negative result may mean that you have not been infected with HCV. You may need to have this test done again to confirm this result. ?A positive result may mean that you have an active HCV infection, or that you have been infected with HCV in the past. An HCV infection may not cause any symptoms, and your body may get rid of the virus without treatment. ?For the HCV RNA test: ?A negative result means that you do not have an active HCV infection. ?A positive result means that you have an active HCV infection. ?For the HCV genotype test, knowing the specific genotype you have will help your health care provider recommend the treatment that will work best for you. ?The quantitative HCV RNA test gives your health care provider an idea of how well your treatment is working. ?If your viral  load is high, you may need different treatment. ?If your viral load is low, your treatment may be working effectively. ?You may have this test repeated to continue to monitor your treatment. ?Talk with your health care provider about what your results mean. ?Questions to ask your health care provider ?Ask your health care provider, or the department that is doing the test: ?When will my results be ready? ?How will I get my results? ?What are my treatment  options? ?What other tests do I need? ?What are my next steps? ?Summary ?Hepatitis C testing is done to check for a liver infection caused by the hepatitis C virus (HCV). ?Hepatitis C is usually diagnosed with three blood tests and monitored with one test. ?A blood sample is required for these tests. It is usually collected by inserting a needle into a vein in the arm. ?Your test results for both the anti-HCV test and the HCV RNA test will be reported as either positive or negative. ?This information is not intended to replace advice given to you by your health care provider. Make sure you discuss any questions you have with your health care provider. ?Document Revised: 09/16/2020 Document Reviewed: 09/16/2020 ?Elsevier Patient Education ? 2023 [employer] ? ?

## 2022-03-13 DIAGNOSIS — M6281 Muscle weakness (generalized): Secondary | ICD-10-CM | POA: Diagnosis not present

## 2022-03-13 DIAGNOSIS — G8918 Other acute postprocedural pain: Secondary | ICD-10-CM | POA: Diagnosis not present

## 2022-03-13 DIAGNOSIS — S82251D Displaced comminuted fracture of shaft of right tibia, subsequent encounter for closed fracture with routine healing: Secondary | ICD-10-CM | POA: Diagnosis not present

## 2022-03-13 DIAGNOSIS — Z7409 Other reduced mobility: Secondary | ICD-10-CM | POA: Diagnosis not present

## 2022-03-13 DIAGNOSIS — M79604 Pain in right leg: Secondary | ICD-10-CM | POA: Diagnosis not present

## 2022-03-13 DIAGNOSIS — J449 Chronic obstructive pulmonary disease, unspecified: Secondary | ICD-10-CM | POA: Diagnosis not present

## 2022-03-14 DIAGNOSIS — R5381 Other malaise: Secondary | ICD-10-CM | POA: Diagnosis not present

## 2022-03-14 NOTE — Telephone Encounter (Signed)
error 

## 2022-03-15 ENCOUNTER — Inpatient Hospital Stay: Payer: Medicaid Other | Admitting: Nurse Practitioner

## 2022-03-15 DIAGNOSIS — R5381 Other malaise: Secondary | ICD-10-CM | POA: Diagnosis not present

## 2022-03-20 DIAGNOSIS — R5381 Other malaise: Secondary | ICD-10-CM | POA: Diagnosis not present

## 2022-03-23 DIAGNOSIS — R5381 Other malaise: Secondary | ICD-10-CM | POA: Diagnosis not present

## 2022-03-29 DIAGNOSIS — S82831A Other fracture of upper and lower end of right fibula, initial encounter for closed fracture: Secondary | ICD-10-CM | POA: Diagnosis not present

## 2022-03-29 DIAGNOSIS — Y9301 Activity, walking, marching and hiking: Secondary | ICD-10-CM | POA: Diagnosis not present

## 2022-05-26 DIAGNOSIS — M79671 Pain in right foot: Secondary | ICD-10-CM | POA: Diagnosis not present

## 2022-05-26 DIAGNOSIS — M79604 Pain in right leg: Secondary | ICD-10-CM | POA: Diagnosis not present

## 2022-05-26 DIAGNOSIS — T8484XA Pain due to internal orthopedic prosthetic devices, implants and grafts, initial encounter: Secondary | ICD-10-CM | POA: Diagnosis not present

## 2022-05-26 DIAGNOSIS — S82424A Nondisplaced transverse fracture of shaft of right fibula, initial encounter for closed fracture: Secondary | ICD-10-CM | POA: Diagnosis not present

## 2022-05-26 DIAGNOSIS — M7989 Other specified soft tissue disorders: Secondary | ICD-10-CM | POA: Diagnosis not present

## 2022-05-26 DIAGNOSIS — S82251A Displaced comminuted fracture of shaft of right tibia, initial encounter for closed fracture: Secondary | ICD-10-CM | POA: Diagnosis not present

## 2022-05-26 DIAGNOSIS — M79661 Pain in right lower leg: Secondary | ICD-10-CM | POA: Diagnosis not present

## 2022-05-26 DIAGNOSIS — S82431A Displaced oblique fracture of shaft of right fibula, initial encounter for closed fracture: Secondary | ICD-10-CM | POA: Diagnosis not present

## 2022-05-26 DIAGNOSIS — L539 Erythematous condition, unspecified: Secondary | ICD-10-CM | POA: Diagnosis not present

## 2022-06-13 DIAGNOSIS — S82431D Displaced oblique fracture of shaft of right fibula, subsequent encounter for closed fracture with routine healing: Secondary | ICD-10-CM | POA: Diagnosis not present

## 2022-06-13 DIAGNOSIS — S82251D Displaced comminuted fracture of shaft of right tibia, subsequent encounter for closed fracture with routine healing: Secondary | ICD-10-CM | POA: Diagnosis not present

## 2022-06-13 DIAGNOSIS — M2011 Hallux valgus (acquired), right foot: Secondary | ICD-10-CM | POA: Diagnosis not present

## 2022-06-13 DIAGNOSIS — M19071 Primary osteoarthritis, right ankle and foot: Secondary | ICD-10-CM | POA: Diagnosis not present

## 2022-06-13 DIAGNOSIS — S82201D Unspecified fracture of shaft of right tibia, subsequent encounter for closed fracture with routine healing: Secondary | ICD-10-CM | POA: Diagnosis not present

## 2022-06-13 DIAGNOSIS — M21611 Bunion of right foot: Secondary | ICD-10-CM | POA: Diagnosis not present

## 2022-07-12 DIAGNOSIS — Z1331 Encounter for screening for depression: Secondary | ICD-10-CM | POA: Diagnosis not present

## 2022-07-12 DIAGNOSIS — H9202 Otalgia, left ear: Secondary | ICD-10-CM | POA: Diagnosis not present

## 2022-10-26 NOTE — Congregational Nurse Program (Signed)
  Dept: [phone]   Congregational Nurse Program Note  Date of Encounter: 10/26/2022 Client to [facility] day center hoping to find his food stamp card had arrived. He also reported "whelps" that he was getting at night on his wrist, ankles and the back of his neck. Discussed several causes, but it only happens at night. No change in detergent, but has started using a different soap. Client will try a milder soap. No rash or bites noted when at clinic today. Again he reports it is only at night. Suggested client try a moisturizer in those areas at night. No other needs at this time. Past Medical History: Past Medical History:  Diagnosis Date   ADHD    Asthma    COPD (chronic obstructive pulmonary disease) (HCC)    Depression    Diabetes mellitus without complication (HCC)    History of blood clots    "blood clots around heart 6 months ago"   Renal disorder    Short-term memory loss     Encounter Details:  CNP Questionnaire - 10/26/22 1015       Questionnaire   Ask client: Do you give verbal consent for me to treat you today? Yes    Student Assistance N/A    Location Patient Served  [facility]    Visit Setting with Client Organization    Patient Status Unknown   lives in [place] housing in [place]   Insurance [place]    Insurance/Financial Assistance Referral N/A    Medication N/A    [facility]   Screening Referrals Made N/A    Medical Referrals Made N/A    Medical Appointment Made N/A    Recently w/o PCP, now 1st time PCP visit completed due to CNs referral or appointment made N/A    Food N/A    Transportation N/A   client has been taking the train from [facility] to [employer] and [facility]   Housing/Utilities N/A    Interpersonal Safety N/A    Interventions Advocate/Support;Educate    Abnormal to Normal Screening Since Last CN Visit N/A    Screenings CN Performed N/A    Sent Client to Lab for: N/A    Did client attend any of the following based off  CNs referral or appointments made? N/A    ED Visit Averted N/A    Life-Saving Intervention Made N/A

## 2023-06-25 IMAGING — US US SCROTUM W/ DOPPLER COMPLETE
1 series · 14 of 25 positions shown · non-contrast
Comparison: None.

CLINICAL DATA: 60-year-old male with LEFT testicle pain for 1 week.

EXAM:
SCROTAL ULTRASOUND
DOPPLER ULTRASOUND OF THE TESTICLES
TECHNIQUE: Complete ultrasound examination of the testicles, epididymis, and
other scrotal structures was performed. Color and spectral Doppler
ultrasound were also utilized to evaluate blood flow to the
testicles.

[Series 1: us scrotum w/doppler · 87 acquisitions, 14 frames shown]
[im 1/87]
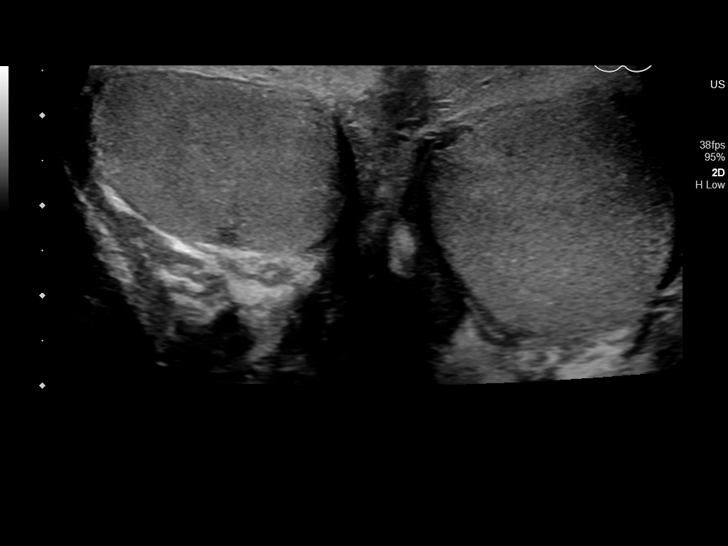
[im 8/87]
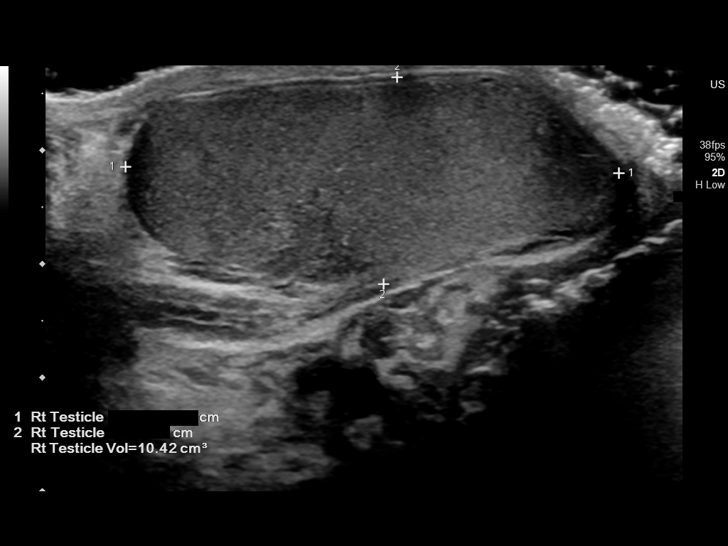
[im 15/87]
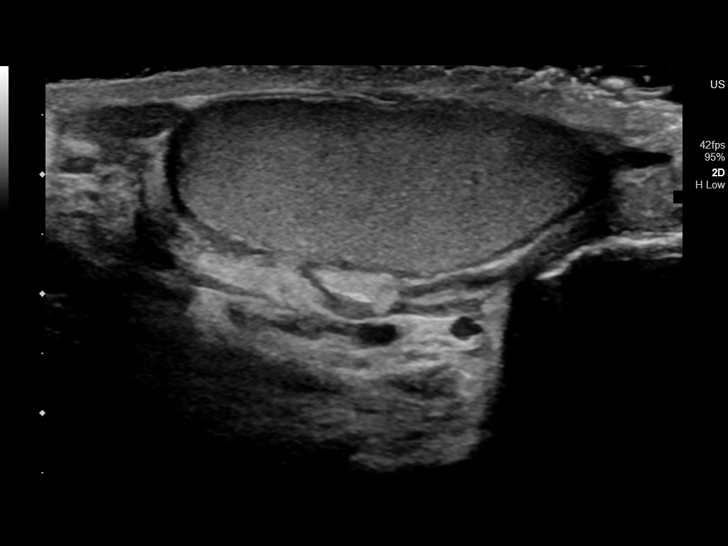
[im 22/87]
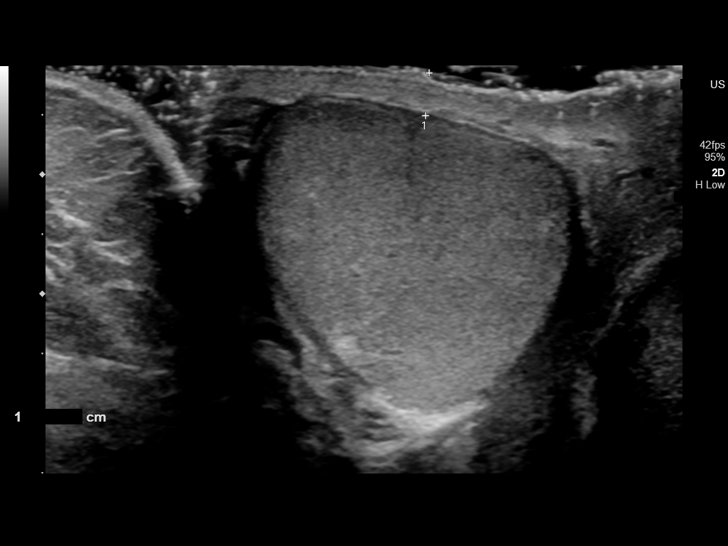
[im 29/87]
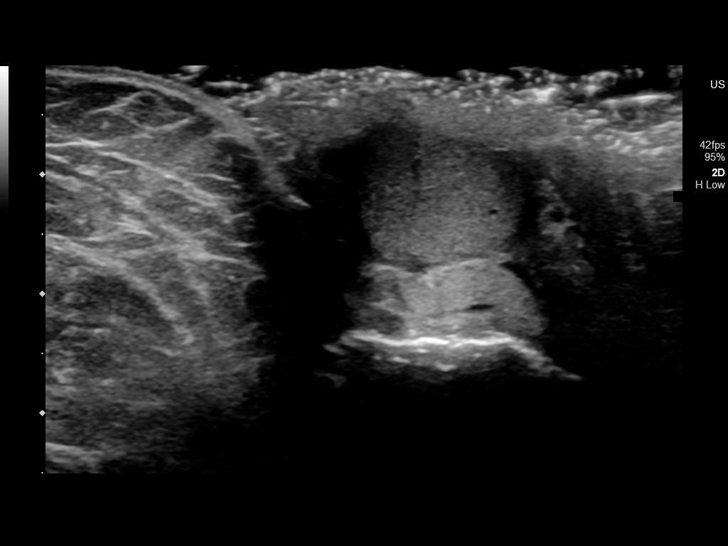
[im 33/87]
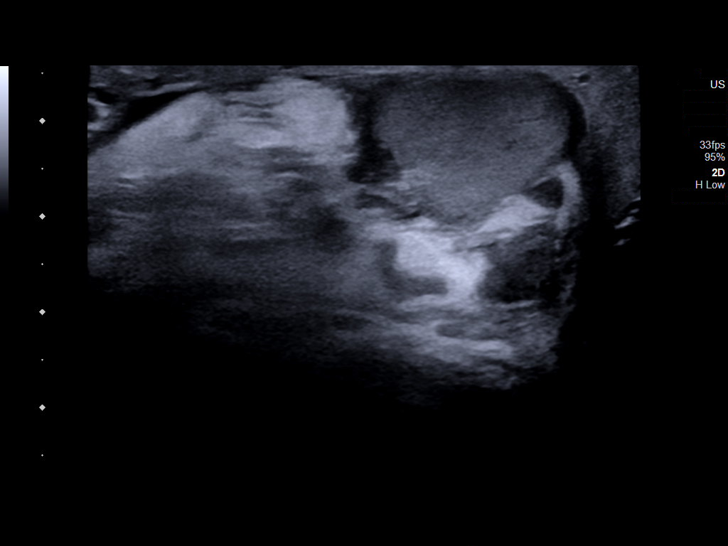
[im 40/87]
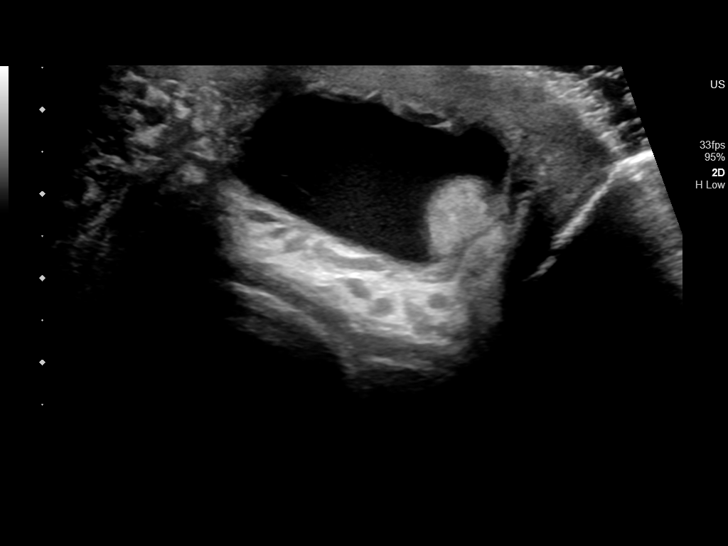
[im 47/87]
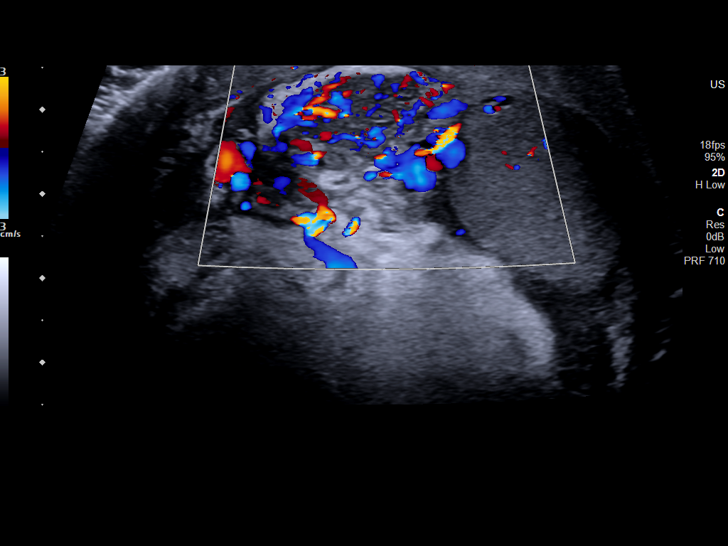
[im 54/87]
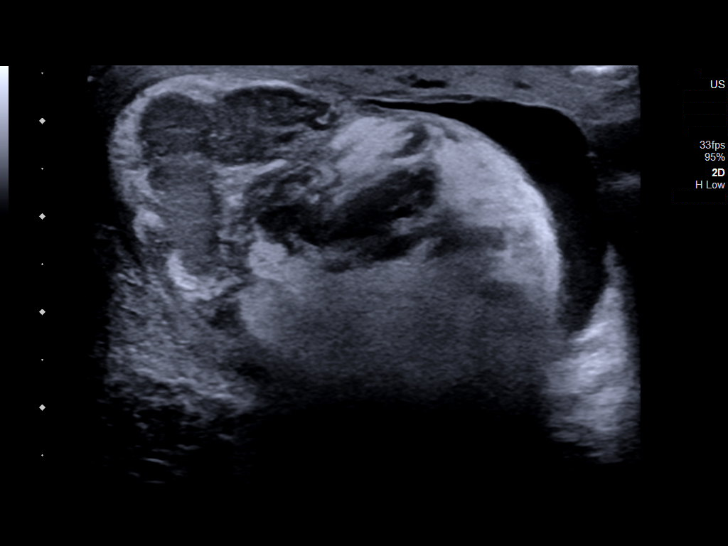
[im 58/87]
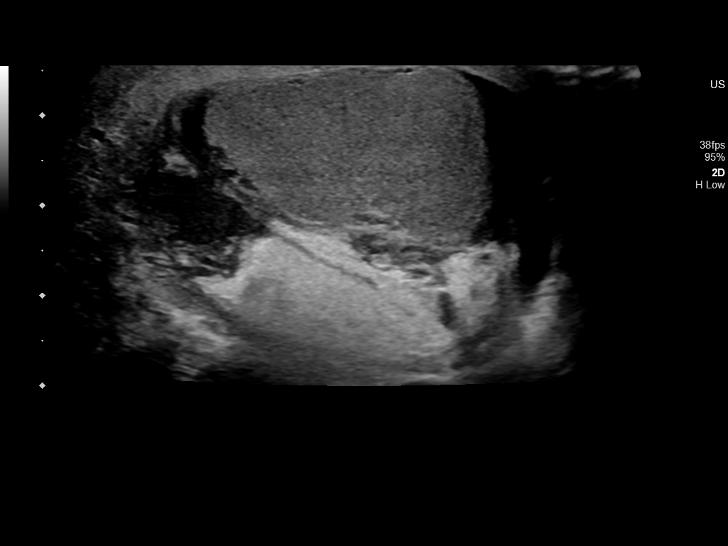
[im 65/87]
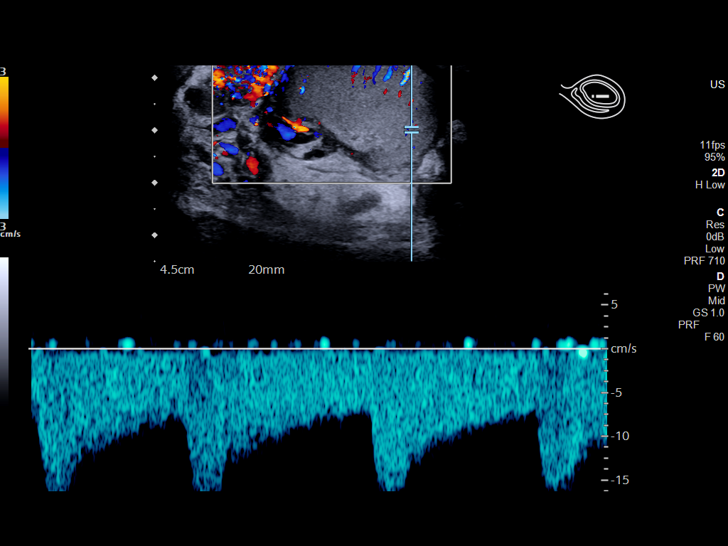
[im 72/87]
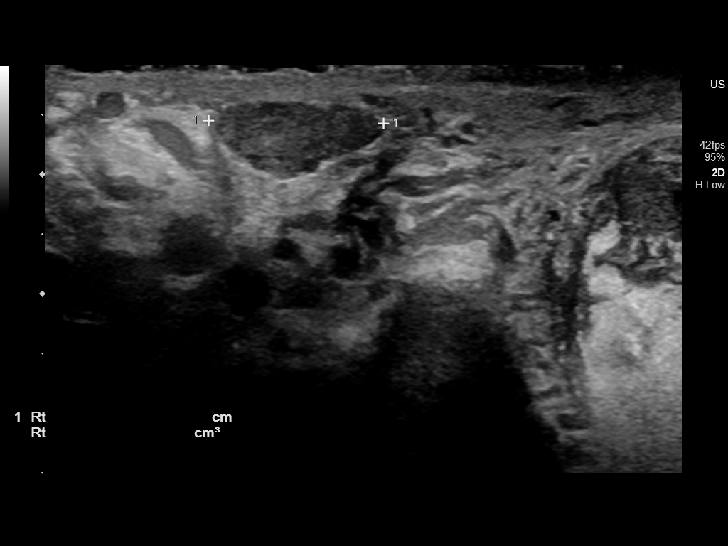
[im 79/87]
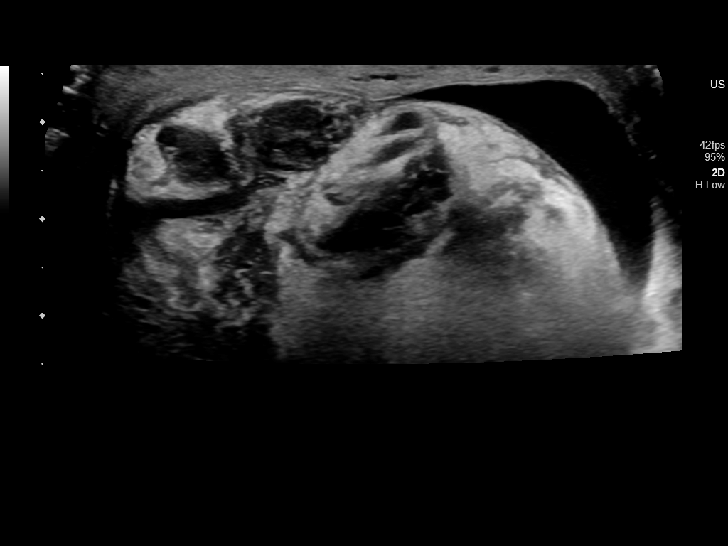
[im 87/87]
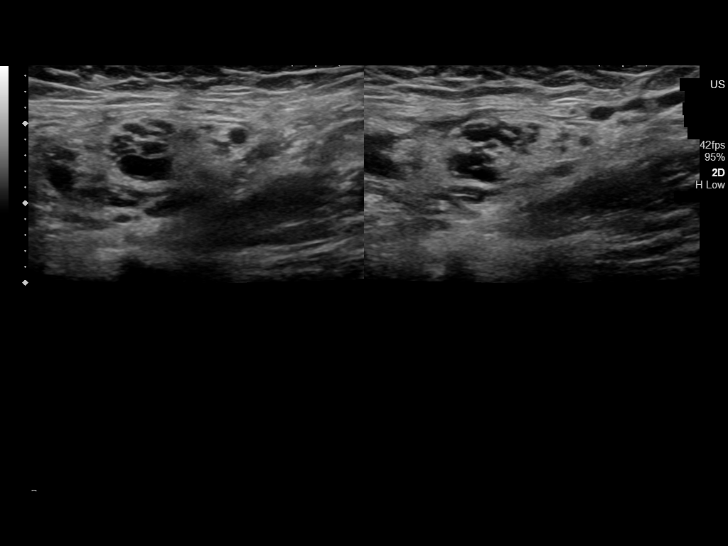

[14 of 25 positions shown; findings below may reference images not displayed]

FINDINGS: Right testicle

Measurements: 4.3 x 1.8 x 2.5 cm. No mass or microlithiasis
visualized.

Left testicle

Measurements: 3.9 x 2 x 2.6 cm. No mass or microlithiasis
visualized.

Right epididymis:  Normal in size and appearance.

Left epididymis: The LEFT epididymis is enlarged with increased
vascularity compatible with epididymitis.

Hydrocele:  A small LEFT hydrocele is noted.

Varicocele:  None visualized.

Pulsed Doppler interrogation of both testes demonstrates normal low
resistance arterial and venous waveforms bilaterally.
IMPRESSION: 1. LEFT epididymitis with small LEFT hydrocele.
2. Normal testicles bilaterally. No evidence of testicular torsion
or mass.

## 2023-11-16 DIAGNOSIS — R110 Nausea: Secondary | ICD-10-CM | POA: Diagnosis not present

## 2023-11-16 DIAGNOSIS — J111 Influenza due to unidentified influenza virus with other respiratory manifestations: Secondary | ICD-10-CM | POA: Diagnosis not present

## 2023-11-16 DIAGNOSIS — R6883 Chills (without fever): Secondary | ICD-10-CM | POA: Diagnosis not present

## 2023-11-16 DIAGNOSIS — J4520 Mild intermittent asthma, uncomplicated: Secondary | ICD-10-CM | POA: Diagnosis not present

## 2024-01-24 DIAGNOSIS — F3289 Other specified depressive episodes: Secondary | ICD-10-CM | POA: Diagnosis not present

## 2024-01-24 DIAGNOSIS — Z6825 Body mass index (BMI) 25.0-25.9, adult: Secondary | ICD-10-CM | POA: Diagnosis not present

## 2024-01-24 DIAGNOSIS — F419 Anxiety disorder, unspecified: Secondary | ICD-10-CM | POA: Diagnosis not present

## 2024-01-24 DIAGNOSIS — E559 Vitamin D deficiency, unspecified: Secondary | ICD-10-CM | POA: Diagnosis not present

## 2024-05-29 DIAGNOSIS — F419 Anxiety disorder, unspecified: Secondary | ICD-10-CM | POA: Diagnosis not present

## 2024-05-29 DIAGNOSIS — N401 Enlarged prostate with lower urinary tract symptoms: Secondary | ICD-10-CM | POA: Diagnosis not present

## 2024-05-29 DIAGNOSIS — J449 Chronic obstructive pulmonary disease, unspecified: Secondary | ICD-10-CM | POA: Diagnosis not present

## 2024-05-29 DIAGNOSIS — R413 Other amnesia: Secondary | ICD-10-CM | POA: Diagnosis not present

## 2024-05-29 DIAGNOSIS — R351 Nocturia: Secondary | ICD-10-CM | POA: Diagnosis not present

## 2024-05-29 DIAGNOSIS — Z13228 Encounter for screening for other metabolic disorders: Secondary | ICD-10-CM | POA: Diagnosis not present

## 2024-05-29 DIAGNOSIS — H539 Unspecified visual disturbance: Secondary | ICD-10-CM | POA: Diagnosis not present

## 2024-05-29 DIAGNOSIS — F32A Depression, unspecified: Secondary | ICD-10-CM | POA: Diagnosis not present

## 2024-05-29 DIAGNOSIS — Z86718 Personal history of other venous thrombosis and embolism: Secondary | ICD-10-CM | POA: Diagnosis not present

## 2024-05-29 DIAGNOSIS — G5792 Unspecified mononeuropathy of left lower limb: Secondary | ICD-10-CM | POA: Diagnosis not present

## 2024-05-29 DIAGNOSIS — Z131 Encounter for screening for diabetes mellitus: Secondary | ICD-10-CM | POA: Diagnosis not present

## 2024-05-29 DIAGNOSIS — Z125 Encounter for screening for malignant neoplasm of prostate: Secondary | ICD-10-CM | POA: Diagnosis not present
